# Patient Record
Sex: Male | Born: 1945 | Race: White | Hispanic: No | Marital: Married | Smoking: Former smoker
Health system: Southern US, Community
[De-identification: ages and names within clinical notes are randomized; demographics above are authoritative.]

## PROBLEM LIST (undated history)

## (undated) DIAGNOSIS — E785 Hyperlipidemia, unspecified: Secondary | ICD-10-CM

## (undated) DIAGNOSIS — I82409 Acute embolism and thrombosis of unspecified deep veins of unspecified lower extremity: Secondary | ICD-10-CM

## (undated) DIAGNOSIS — I10 Essential (primary) hypertension: Secondary | ICD-10-CM

## (undated) DIAGNOSIS — IMO0002 Reserved for concepts with insufficient information to code with codable children: Secondary | ICD-10-CM

## (undated) DIAGNOSIS — K5720 Diverticulitis of large intestine with perforation and abscess without bleeding: Secondary | ICD-10-CM

## (undated) DIAGNOSIS — G4730 Sleep apnea, unspecified: Secondary | ICD-10-CM

## (undated) DIAGNOSIS — D72829 Elevated white blood cell count, unspecified: Secondary | ICD-10-CM

## (undated) HISTORY — DX: Hyperlipidemia, unspecified: E78.5

## (undated) HISTORY — PX: COLON SURGERY: SHX602

## (undated) HISTORY — PX: PROSTATE SURGERY: SHX751

## (undated) HISTORY — DX: Elevated white blood cell count, unspecified: D72.829

## (undated) HISTORY — DX: Essential (primary) hypertension: I10

## (undated) HISTORY — DX: Diverticulitis of large intestine with perforation and abscess without bleeding: K57.20

## (undated) HISTORY — DX: Reserved for concepts with insufficient information to code with codable children: IMO0002

## (undated) HISTORY — DX: Sleep apnea, unspecified: G47.30

## (undated) HISTORY — DX: Acute embolism and thrombosis of unspecified deep veins of unspecified lower extremity: I82.409

---

## 1999-10-23 ENCOUNTER — Encounter: Payer: Self-pay | Admitting: Family Medicine

## 1999-10-23 ENCOUNTER — Inpatient Hospital Stay (HOSPITAL_COMMUNITY): Admission: RE | Admit: 1999-10-23 | Discharge: 1999-10-29 | Payer: Self-pay | Admitting: Family Medicine

## 2003-08-28 ENCOUNTER — Ambulatory Visit (HOSPITAL_BASED_OUTPATIENT_CLINIC_OR_DEPARTMENT_OTHER): Admission: RE | Admit: 2003-08-28 | Discharge: 2003-08-28 | Payer: Self-pay | Admitting: Internal Medicine

## 2006-02-10 ENCOUNTER — Ambulatory Visit (HOSPITAL_COMMUNITY): Admission: RE | Admit: 2006-02-10 | Discharge: 2006-02-10 | Payer: Self-pay | Admitting: Urology

## 2006-03-29 ENCOUNTER — Ambulatory Visit: Payer: Self-pay | Admitting: Oncology

## 2006-04-05 LAB — COMPREHENSIVE METABOLIC PANEL
ALT: 15 U/L (ref 0–40)
AST: 22 U/L (ref 0–37)
Albumin: 4.3 g/dL (ref 3.5–5.2)
Alkaline Phosphatase: 66 U/L (ref 39–117)
Glucose, Bld: 136 mg/dL — ABNORMAL HIGH (ref 70–99)
Potassium: 4 mEq/L (ref 3.5–5.3)
Sodium: 138 mEq/L (ref 135–145)
Total Protein: 6.8 g/dL (ref 6.0–8.3)

## 2006-04-05 LAB — HYPERCOAGULABLE PANEL, COMPREHENSIVE
Beta-2 Glyco I IgG: <4 U/mL (ref <20)
Homocysteine: 13.3 umol/L (ref 4.0–15.4)
Protein C Activity: 119 % (ref 91–147)
Protein C, Total: 82 % (ref 63–153)
Protein S Ag, Total: 88 % (ref 58–146)

## 2006-05-16 ENCOUNTER — Encounter (INDEPENDENT_AMBULATORY_CARE_PROVIDER_SITE_OTHER): Payer: Self-pay | Admitting: Specialist

## 2006-05-16 ENCOUNTER — Observation Stay (HOSPITAL_COMMUNITY): Admission: RE | Admit: 2006-05-16 | Discharge: 2006-05-17 | Payer: Self-pay | Admitting: Urology

## 2010-06-03 ENCOUNTER — Encounter: Admission: RE | Admit: 2010-06-03 | Discharge: 2010-06-03 | Payer: Self-pay | Admitting: Otolaryngology

## 2011-04-02 NOTE — Op Note (Signed)
NAMELELON, IKARD NO.:  [id number]   MEDICAL RECORD NO.:  [id number]          PATIENT TYPE:  INP   LOCATION:  0003                         FACILITY:  [facility]   PHYSICIAN:  Heloise Purpura, MD      DATE OF BIRTH:  [birth year]   DATE OF PROCEDURE:  05/16/2006  DATE OF DISCHARGE:                                 OPERATIVE REPORT   PREOPERATIVE DIAGNOSIS:  Clinically localized adenocarcinoma of prostate.   POSTOPERATIVE DIAGNOSIS:  Clinically localized adenocarcinoma of prostate.   PROCEDURE:  1.  Robotic assisted laparoscopic radical prostatectomy (bilateral nerve      sparing).  2.  Laparoscopic bilateral pelvic lymphadenectomy.   SURGEON:  Heloise Purpura, MD   ASSISTANT:  Dr. Cornelious Bryant   ANESTHESIA:  General.   COMPLICATIONS:  None.   ESTIMATED BLOOD LOSS:  500 mL.   INTRAVENOUS FLUIDS:  1000 mL of lactated Ringer's   SPECIMENS:  1.  Prostate and seminal vesicles.  2.  Right pelvic lymph nodes.  3.  Left pelvic lymph nodes.   DRAINS:  1.  20-French Coude catheter.  2.  #19 Blake pelvic drain.   INDICATIONS:  Mr. Fults is a 65 year old gentleman with clinical stage T1C  prostate cancer with a PSA of 13.74 and a Gleason score 3 +4 equals 7.  His  preoperative IPSS score was 2 then IIEF score of 25.  After discussing  management options for clinically localized prostate cancer, undergoing a  metastatic evaluation including a bone scan and CT scan which were both  negative, the patient elected to proceed with the above procedures.  Potential risks and benefits of these procedures were discussed with the  patient and he consented.   DESCRIPTION OF PROCEDURE:  The patient was taken to the operating room and a  general anesthetic was administered.  He was given preoperative antibiotics,  placed in the dorsal lithotomy position, prepped and draped in the usual  sterile fashion.  A preoperative time out was performed.  Next a Foley  catheter was  inserted into the bladder.  A site was then selected 18 cm from  the pubic symphysis and just to left of the umbilicus for placement of the  camera port.  This was placed using a standard open Hasson technique.  This  allowed entry into the peritoneal cavity under direct vision.  A 12 mm port  was placed and a pneumoperitoneum was established.  The remaining ports were  then placed.  Bilateral 8 mm robotic ports were placed 16 cm from the pubic  symphysis and 10 cm lateral to the camera port.  An additional 8 mm port was  placed in the far left lateral abdominal wall.  A 5 mm port was placed  between the camera port and the right robotic port.  An additional 12 mm  port was placed in the far right lateral abdominal wall for laparoscopic  assistance.  All ports were placed under direct vision without difficulty.  The surgical cart was then docked.  With the aid of the cautery scissors,  the bladder was reflected posteriorly  allowing entry into the space of  Retzius and identification of the endopelvic fascia and prostate.  The  endopelvic fascia was then incised from the apex back to the base of the  prostate bilaterally and the underlying levator muscle fibers were swept  laterally off the prostate.  This isolated the dorsal venous complex which  was then stapled and divided with a 45 mm flex ETS stapler.  The bladder  neck was then identified with the aid of Foley catheter manipulation.  The  anterior bladder neck was then incised allowing identification of the Foley  catheter.  The Foley catheter balloon was deflated and the catheter was  brought into the operative field and used to retract the prostate  anteriorly.  This allowed exposure of the posterior bladder neck.  Due to  the patient's large prostate size which was known preoperatively, a careful  examination was performed to determine if there was a median lobe.  There  did appear to be a small median lobe.  Indigo carmine was  administered to  allow identification of the ureteral orifices.  The median lobe was then  excised and dissection continued posteriorly until the vasa deferentia and  seminal vesicles were identified.  The vasa deferentia were isolated and  divided.  The seminal vesicles were similarly isolated free from the  surrounding structures with care to provide adequate hemostasis on the  seminal vesicle artery bilaterally.  The seminal vesicles were then lifted  anteriorly and the space between Denonvilliers' fascia and the anterior  rectum was bluntly developed thereby isolating the vascular pedicles of the  prostate.  Attention then turned to the anterior portion of the prostate.  The lateral prostatic fascia was sharply incised bilaterally allowing the  neurovascular bundles to be swept laterally and posteriorly off the  prostate.  The vascular pedicles of prostate were then ligated with  hemoclips and sharply divided above the level of the neurovascular bundles  resulting in bilateral nerve preservation.  The neurovascular bundles were  then swept off the prostate out to the urethra apically.  The urethra was  then identified.  There was noted to be venous bleeding from the dorsal  venous complex.  Therefore a figure-of-eight 0 PDS suture was placed along  with a buttressing suture to provide hemostasis.  The urethra was then  isolated and sharply divided allowing the prostate specimen to be  disarticulated and placed up into the abdominal cavity.  The pelvis was then  copiously irrigated and with irrigation in the pelvis, air was injected into  the rectal catheter and there was no evidence of a rectal injury.  Hemostasis appeared excellent and attention turned to the right pelvic  sidewall.  The fibrofatty tissue between the external iliac vein, confluence  of the iliac vessels, obturator nerve, and Cooper's ligament was dissected free from the pelvic sidewall with hemoclips used for  hemostasis and  lymphostasis.  This lymphatic packet was then removed for permanent  pathologic analysis.  An identical procedure was then performed on the  contralateral side.  Attention then turned to the urethral anastomosis.  A  double-armed 3-0 Monocryl suture was used to perform a 360 degrees running  anastomosis between the bladder neck and urethra in a tension-free fashion.  A new 20-French Coude catheter was then inserted into the bladder and  irrigated.  There no blood clots in the bladder and the anastomosis appeared  to be watertight.  A #19 Blake drain was then brought through the left  robotic port and appropriately positioned in the pelvis.  It was secured to  the skin with a nylon suture.  The surgical cart was then undocked.  The  prostate specimen was then placed into the Endopouch retrieval bag via the  periumbilical port site for later removal.  A 0 Vicryl stitch was placed  with the aid of the [employer] suture passer for closure of the right  lateral 12 mm port site.  The periumbilical port incision was then slightly  extended inferiorly allowing the prostate specimen to be removed intact  within the Endopouch retrieval bag.  This fascial opening was then closed  with two running 0 Vicryl sutures which were tied in the midportion of the  incision.  Prior to removal of the camera.  All other remaining ports were  removed under direct vision.  Quarter percent Marcaine was then injected  into all incision sites and staples were used to reapproximate the skin at  all  incision sites.  All sponge and needle counts were correct x2 at the end of  procedure.  The patient appeared to tolerate the procedure well and without  complications.  He was able to be extubated and transferred to recovery in  excellent condition.           ______________________________  Heloise Purpura, MD  Electronically Signed     LB/MEDQ  D:  05/16/2006  T:  05/16/2006  Job:  [id number]

## 2011-04-02 NOTE — H&P (Signed)
NAMEJACKSYN, [name] NO.:  [id number]   MEDICAL RECORD NO.:  [id number]          PATIENT TYPE:  INP   LOCATION:  0003                         [facility]ACILITY:  [facility]   PHYSICIAN:  Heloise Purpura, MD      DATE OF BIRTH:  November 25, 1945   DATE OF ADMISSION:  05/16/2006  DATE OF DISCHARGE:                                HISTORY & PHYSICAL   CHIEF COMPLAINT:  Prostate cancer.   HISTORY:  [name] is a 64 year old gentleman with recently diagnosed  clinical stage T1C prostate cancer with a PSA of 13.74 and Gleason score 3 +  4 = 7.  He underwent a CT scan and bone scan, and which were both negative  for metastatic disease.  After discussing options for clinically localized  prostate cancer management, he elected to proceed with surgical therapy.   PAST MEDICAL HISTORY:  1.  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  He has undergone an evaluation by Dr. Eli Hose at the cancer      center.  Dr. 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  He has made recommendations of early      mobilization and compression stockings.  2.  Sleep apnea, requiring CPAP.   PAST SURGICAL HISTORY:  None.   MEDICATIONS:  Avodart 0.5 mg p.o. daily.   ALLERGIES:  NO KNOWN DRUG ALLERGIES.   FAMILY HISTORY:  No GU malignancy of prostate cancer.   SOCIAL HISTORY:  The patient works as an Personnel officer.  He smoked 1 pack of  cigarettes per day for 15 years and quit in 1976.  He denies alcohol use.   PHYSICAL EXAMINATION:  CONSTITUTIONAL:  The patient is a well-nourished,  well-developed age-appropriate male in no acute distress.  CARDIOVASCULAR:  Regular rate and rhythm without obvious murmurs.  LUNGS:  Clear bilaterally.  ABDOMEN:  Soft, nontender, nondistended and mildly obese.  DIGITAL RECTAL EXAM:  The patient is prostate is significantly enlarged  without nodularity or induration.   IMPRESSION:   Clinically localized intermediate risk prostate cancer.   PLAN:  [name] will undergo robotic assisted laparoscopic radical  prostatectomy and bilateral pelvic lymphadenectomy.  He will then be  admitted to the hospital for routine postoperative care.           ______________________________  Heloise Purpura, MD  Electronically Signed     LB/MEDQ  D:  05/16/2006  T:  05/16/2006  Job:  [id number]   cc:   Blenda Nicely. [employer]

## 2011-04-02 NOTE — Discharge Summary (Signed)
[name], [name] NO.:  [id number]   MEDICAL RECORD NO.:  [id number]          PATIENT TYPE:  INP   LOCATION:  1418                         [facility]AC[facility]   PHYSICIAN:  Heloise Purpura, MD      DATE OF BIRTH:  1946-05-07   DATE OF ADMISSION:  05/16/2006  DATE OF DISCHARGE:  05/17/2006                                 DISCHARGE SUMMARY   ADMISSION DIAGNOSIS:  Prostate cancer.   DISCHARGE DIAGNOSIS:  Prostate cancer.   PROCEDURES:  1.  Robotic-assisted laparoscopic radical prostatectomy.  2.  Bilateral pelvic lymphadenectomy.   HISTORY:  [name] is a [age]-year-old patient with clinical stage T1C  prostate cancer with a PSA of 13.74 and Gleason score of 3+4=7.  He  underwent a metastatic evaluation including a CT scan and bone scan, which  were both negative for metastatic prostate cancer.  After discussing options  for clinically localized adenocarcinoma of the prostate, he elected to  proceed with a robotic-assisted laparoscopic radical prostatectomy.   HOSPITAL COURSE:  On May 16, 2006, the patient was admitted to the hospital  and taken to the operating room, he underwent an uneventful robotic  prostatectomy.  Postoperatively, he was able to be transferred to a regular  hospital room following recovery from anesthesia.  As soon as he was able to  ambulate postoperatively, he was begun ambulating.  In addition, due to his  prior history of a deep venous thrombosis, he was administered subcutaneous  heparin while hospitalized and also managed with pneumatic compression hose.  By the morning of postoperative day #1, the patient was ambulating without  difficulty.  He was able to begin a clear liquid diet and then transitioned  to oral pain medication.  By the afternoon of postoperative day #1, his  pelvic drain had minimal output and he maintained excellent urine output.  Therefore, his pelvic drain was able to be discontinued and after receiving  instructions on Foley catheter care was able to be discharged home in  excellent condition.   DISPOSITION:  Home.   DISCHARGE MEDICATIONS:  [name] was instructed to resume his regular  home medications except for his Avodart, which was discontinued.  He was  also told to refrain from using any aspirin, nonsteroidal anti-inflammatory  drugs, or herbal supplements.  He was given a prescription to take Vicodin  as needed for pain, Cipro to begin 1 day prior to his return visit for Foley  catheter removal, and Colace to use as a stool softener.   DISCHARGE INSTRUCTIONS:  The patient was instructed on the extreme  importance of frequent mobilization and ambulation at home to prevent a  thromboembolism.  He was also carefully instructed on the signs and symptoms  that he should be aware of.  He was instructed to gradually advance his diet  once passing flatus.  The patient was also told that he should refrain from  any heavy lifting, strenuous activity, or driving.  He was instructed on the  signs and symptoms of wound infection as well as routine Foley catheter care  and told  to call should he have any problems.   FOLLOW UP:  [name] will return in 1 week for removal of his Foley  catheter and to review his surgical pathology in detail.           ______________________________  Heloise Purpura, MD  Electronically Signed     LB/MEDQ  D:  05/18/2006  T:  05/18/2006  Job:  [id number]   cc:   Jamison Neighbor, M.D.  Fax: [phone]

## 2011-05-10 ENCOUNTER — Emergency Department (INDEPENDENT_AMBULATORY_CARE_PROVIDER_SITE_OTHER): Payer: 59

## 2011-05-10 ENCOUNTER — Emergency Department (HOSPITAL_BASED_OUTPATIENT_CLINIC_OR_DEPARTMENT_OTHER)
Admission: EM | Admit: 2011-05-10 | Discharge: 2011-05-10 | Disposition: A | Discharge status: Other Acute Inpatient Hospital | Payer: 59 | Source: Home / Self Care | Attending: Emergency Medicine | Admitting: Emergency Medicine

## 2011-05-10 ENCOUNTER — Emergency Department (HOSPITAL_COMMUNITY): Payer: 59

## 2011-05-10 ENCOUNTER — Inpatient Hospital Stay (HOSPITAL_COMMUNITY)
Admission: EM | Admit: 2011-05-10 | Discharge: 2011-05-18 | DRG: 357 | Disposition: A | Discharge status: Home / Self Care | Payer: 59 | Attending: Surgery | Admitting: Surgery

## 2011-05-10 DIAGNOSIS — Q619 Cystic kidney disease, unspecified: Secondary | ICD-10-CM | POA: Insufficient documentation

## 2011-05-10 DIAGNOSIS — K560 Paralytic ileus: Secondary | ICD-10-CM | POA: Diagnosis not present

## 2011-05-10 DIAGNOSIS — K5730 Diverticulosis of large intestine without perforation or abscess without bleeding: Secondary | ICD-10-CM | POA: Insufficient documentation

## 2011-05-10 DIAGNOSIS — I10 Essential (primary) hypertension: Secondary | ICD-10-CM | POA: Insufficient documentation

## 2011-05-10 DIAGNOSIS — K631 Perforation of intestine (nontraumatic): Secondary | ICD-10-CM

## 2011-05-10 DIAGNOSIS — G4733 Obstructive sleep apnea (adult) (pediatric): Secondary | ICD-10-CM | POA: Diagnosis present

## 2011-05-10 DIAGNOSIS — K5732 Diverticulitis of large intestine without perforation or abscess without bleeding: Secondary | ICD-10-CM

## 2011-05-10 DIAGNOSIS — K929 Disease of digestive system, unspecified: Secondary | ICD-10-CM | POA: Diagnosis not present

## 2011-05-10 DIAGNOSIS — R109 Unspecified abdominal pain: Secondary | ICD-10-CM

## 2011-05-10 DIAGNOSIS — Y838 Other surgical procedures as the cause of abnormal reaction of the patient, or of later complication, without mention of misadventure at the time of the procedure: Secondary | ICD-10-CM | POA: Diagnosis not present

## 2011-05-10 DIAGNOSIS — Z8546 Personal history of malignant neoplasm of prostate: Secondary | ICD-10-CM

## 2011-05-10 DIAGNOSIS — R112 Nausea with vomiting, unspecified: Secondary | ICD-10-CM

## 2011-05-10 DIAGNOSIS — R1031 Right lower quadrant pain: Secondary | ICD-10-CM | POA: Insufficient documentation

## 2011-05-10 DIAGNOSIS — R1013 Epigastric pain: Secondary | ICD-10-CM | POA: Diagnosis present

## 2011-05-10 LAB — COMPREHENSIVE METABOLIC PANEL
ALT: 17 U/L (ref 0–53)
Alkaline Phosphatase: 63 U/L (ref 39–117)
BUN: 19 mg/dL (ref 6–23)
CO2: 27 mEq/L (ref 19–32)
Glucose, Bld: 162 mg/dL — ABNORMAL HIGH (ref 70–99)
Potassium: 3.3 mEq/L — ABNORMAL LOW (ref 3.5–5.1)
Sodium: 136 mEq/L (ref 135–145)

## 2011-05-10 LAB — DIFFERENTIAL
Basophils Relative: 0 % (ref 0–1)
Lymphocytes Relative: 10 % — ABNORMAL LOW (ref 12–46)
Monocytes Absolute: 1.6 10*3/uL — ABNORMAL HIGH (ref 0.1–1.0)
Monocytes Relative: 9 % (ref 3–12)
Neutro Abs: 14.3 10*3/uL — ABNORMAL HIGH (ref 1.7–7.7)
Neutrophils Relative %: 81 % — ABNORMAL HIGH (ref 43–77)

## 2011-05-10 LAB — URINALYSIS, ROUTINE W REFLEX MICROSCOPIC
Bilirubin Urine: NEGATIVE
Hgb urine dipstick: NEGATIVE
Ketones, ur: NEGATIVE mg/dL
Nitrite: NEGATIVE
Protein, ur: NEGATIVE mg/dL
Specific Gravity, Urine: 1.024 (ref 1.005–1.030)
Urobilinogen, UA: 1 mg/dL (ref 0.0–1.0)

## 2011-05-10 LAB — CBC
HCT: 41 % (ref 39.0–52.0)
Hemoglobin: 14.5 g/dL (ref 13.0–17.0)
MCH: 30.5 pg (ref 26.0–34.0)
RBC: 4.76 MIL/uL (ref 4.22–5.81)

## 2011-05-10 LAB — URINE MICROSCOPIC-ADD ON

## 2011-05-10 MED ORDER — IOHEXOL 300 MG/ML  SOLN
100.0000 mL | Freq: Once | INTRAMUSCULAR | Status: AC | PRN
  Administered 2011-05-10: 97 mL via INTRAVENOUS

## 2011-05-11 LAB — COMPREHENSIVE METABOLIC PANEL
Alkaline Phosphatase: 52 U/L (ref 39–117)
BUN: 15 mg/dL (ref 6–23)
CO2: 28 mEq/L (ref 19–32)
Chloride: 100 mEq/L (ref 96–112)
Creatinine, Ser: 1.3 mg/dL (ref 0.50–1.35)
GFR calc non Af Amer: 56 mL/min — ABNORMAL LOW (ref >60)
Potassium: 4.1 mEq/L (ref 3.5–5.1)
Total Bilirubin: 1.5 mg/dL — ABNORMAL HIGH (ref 0.3–1.2)

## 2011-05-11 LAB — CBC
HCT: 44.2 % (ref 39.0–52.0)
Hemoglobin: 14.8 g/dL (ref 13.0–17.0)
MCV: 88.9 fL (ref 78.0–100.0)
RBC: 4.97 MIL/uL (ref 4.22–5.81)
WBC: 15.1 10*3/uL — ABNORMAL HIGH (ref 4.0–10.5)

## 2011-05-11 NOTE — Op Note (Signed)
[name], [name] NO.:  [id number]  MEDICAL RECORD NO.:  [id number]  LOCATION:  1540                         FACILITY:  [facility]  PHYSICIAN:  Wilmon Arms. Corliss Skains, M.D. DATE OF BIRTH:  01-22-1946  DATE OF PROCEDURE:  05/10/2011 DATE OF DISCHARGE:                              OPERATIVE REPORT   PREOPERATIVE DIAGNOSIS:  Free intraperitoneal air.  POSTOPERATIVE DIAGNOSIS:  Perforated diverticulitis.  PROCEDURE:  Exploratory laparotomy, placement of pelvic drain.  SURGEON:  Wilmon Arms. Corliss Skains, M.D.  ASSISTANT:  Adolph Pollack, M.D.  ANESTHESIA:  General endotracheal.  INDICATIONS:  This is a 65 year old male in reasonably good health, who presented with 24 hours of worsening abdominal pain.  He was evaluated in the Emergency Department at [facility] and was noted to have elevated white blood cell count as well as free intraperitoneal air on plain film and CT scan.  The CT scan was unable to discern the etiology of his free air, but he was emergently transferred for surgical evaluation.  We recommended exploratory laparotomy.  DESCRIPTION OF PROCEDURE:  The patient was brought to the operating, placed in supine position on operating table.  After an adequate level of general anesthesia was obtained, the patient's abdomen was shaved, prepped with ChloraPrep and draped in sterile fashion.  A Foley catheter had been placed under sterile technique.  A time-out was taken that showed the proper patient, proper procedure.  We made a vertical midline incision in the upper abdomen.  Dissection was carried down to the fascia.  We entered the peritoneal cavity bluntly.  No rush of air was noted.  No purulent fluid was encountered.  Minimal free fluid was in the abdomen.  There was no foul odor.  We opened the incision widely and began exploring the abdomen.  We began at the stomach.  The stomach appeared normal anteriorly.  We could not palpate any area  of thickening.  We examined the pylorus and the first part of the duodenum. We opened the lesser sac and the posterior stomach also.  It felt normal.  The gallbladder was noninflamed and no stones were palpated. The liver appeared grossly normal.  We identified the ligament of Treitz and ran the small bowel distally to the cecum.  This all appeared normal.  The appendix was identified and was normal.  The cecum was soft and free of any inflammation.  We traced the colon up the ascending colon across the transverse colon down the descending colon.  The patient had some adhesions in the pelvis from his previous prostatectomy.  We took down some of these adhesions and we were able to identify the sigmoid colon.  There is minimal thickening, but the patient has extensive diverticular disease.  We located one area of focal thickening and this appeared to be a perforated diverticulum that had subsequently sealed itself off.  There was virtually no contamination around this area.  There was a little bit of inflammation in the fat around the diverticulum.  The colon itself appeared soft and normal.  There was no purulence in the pelvis.  We took some of the adjacent appendices epiploica and sewed these over the sealed  off perforation.  We then thoroughly irrigated the pelvis.  Two pelvic drains were placed through stab incisions on each side and placed down to the pelvis next to the sigmoid colon.  The area of the sigmoid colon that is involved seems to be fairly proximal.  We then thoroughly irrigated all four quadrants of the abdomen with warm saline.  The fascia was then reapproximated with double-stranded #1 PDS.  Staples were used to close the skin.  The drains were placed to bulb suction.  The patient was extubated and brought to recovery room in stable condition.  All sponge, instrument, needle counts correct.     Wilmon Arms. Corliss Skains, M.D.     MKT/MEDQ  D:  05/10/2011  T:   05/10/2011  Job:  [id number]  Electronically Signed by Manus Rudd M.D. on 05/11/2011 08:44:17 PM

## 2011-05-13 LAB — BASIC METABOLIC PANEL
CO2: 28 mEq/L (ref 19–32)
Calcium: 8.2 mg/dL — ABNORMAL LOW (ref 8.4–10.5)
Chloride: 102 mEq/L (ref 96–112)
Creatinine, Ser: 1.08 mg/dL (ref 0.50–1.35)
GFR calc Af Amer: >60 mL/min (ref >60)
Sodium: 135 mEq/L (ref 135–145)

## 2011-05-13 LAB — CBC
MCH: 29.5 pg (ref 26.0–34.0)
Platelets: 185 10*3/uL (ref 150–400)
RBC: 4.17 MIL/uL — ABNORMAL LOW (ref 4.22–5.81)
RDW: 12.6 % (ref 11.5–15.5)
WBC: 9.4 10*3/uL (ref 4.0–10.5)

## 2011-05-16 LAB — CBC
MCV: 86.6 fL (ref 78.0–100.0)
Platelets: 240 10*3/uL (ref 150–400)
RBC: 4.49 MIL/uL (ref 4.22–5.81)
RDW: 12.8 % (ref 11.5–15.5)
WBC: 9.5 10*3/uL (ref 4.0–10.5)

## 2011-05-18 NOTE — H&P (Signed)
NAMENEEV, MCMAINS NO.:  [id number]  MEDICAL RECORD NO.:  [id number]  LOCATION:  0098                         FACILITY:  [facility]  PHYSICIAN:  Wilmon Arms. Corliss Skains, M.D. DATE OF BIRTH:  [birth year]  DATE OF ADMISSION:  05/10/2011 DATE OF DISCHARGE:                             HISTORY & PHYSICAL   TIME OF ADMISSION:  11:20 a.m.  ADMITTING PHYSICIAN:  Wilmon Arms. Laelyn Blumenthal, M.D.  PRIMARY CARE PHYSICIAN:  Soyla Murphy. Renne Crigler, M.D.  CHIEF COMPLAINT:  Abdominal pain.  HISTORY OF PRESENT ILLNESS:  Mr. Mogel is a 65 year old white male with a history of hypertension, obstructive sleep apnea, and prostate cancer who developed epigastric pain yesterday around 1700.  He took 2 Motrin yesterday morning for some hip pain he was having.  As his pain progressed from the epigastric region, it began tracking down the right side.  He subsequently developed nausea and vomiting but denies any fevers or chills.  Overnight, the pain progressively worsened.  The patient presented to [facility] for further evaluation.  Upon arrival, he was noted to have a white blood cell count of 17,000.  He then had a CT scan of the abdomen and pelvis which revealed pneumoperitoneum.  There were diverticula seen but no evidence of diverticulitis.  An unknown source was able to be identified but he definitely had free air from a perforated viscus.  At this time, the patient was transferred to [facility] Emergency Department and we gladly accepted the patient.  REVIEW OF SYSTEMS:  Please see HPI.  Otherwise, all other systems have been reviewed and are essentially negative.  He does complain of some mild incontinence after he had a prostatectomy.  FAMILY HISTORY:  Noncontributory.  He does have a family history of hypertension and a mom who has lung cancer.  PAST MEDICAL HISTORY: 1. Hypertension. 2. Obstructive sleep apnea. 3. Prostate cancer.  PAST SURGICAL HISTORY:  Laparoscopic  robotic prostatectomy in 2007 by Dr. Laverle Patter.  SOCIAL HISTORY:  The patient is married.  He denies any alcohol, tobacco or illicit drug abuse.  He is an Personnel officer.  ALLERGIES:  NKDA.  MEDICATIONS:  Include: 1. Triamterene/hydrochlorothiazide 37.5/25 mg half tablet daily. 2. Pepto-Bismol as needed. 3. Ibuprofen 200 mg 2 tablets as needed. 4. Multivitamin.  PHYSICAL EXAMINATION:  GENERAL:  Mr. Hark is a very pleasant, obese 65 year old white male who is currently in bed, in no acute distress. VITAL SIGNS:  Temperature 98.1, pulse 86, respirations 16, blood pressure 124/66. HEENT:  Head is normocephalic, atraumatic.  Sclerae noninjected.  Pupils are equal, round and reactive to light.  Ears and nose without any obvious masses or lesions.  No rhinorrhea.  Mouth is pink.  Throat shows no exudate. HEART:  Regular rate and rhythm.  Normal S1 and S2.  No murmurs, gallops, or rubs are noted.  He does have palpable carotid, radial and pedal pulses bilaterally. LUNGS:  Clear to auscultation bilaterally with no wheezes, rhonchi or rales noted.  Respiratory effort is nonlabored. ABDOMEN:  Soft with very minimal distention.  He does have active bowel sounds.  He is tender in the epigastric region as well as radiating down the right side of  his abdomen.  He does have some mild focal peritonitis on the right side.  Otherwise, no rebounding or guarding is noted.  He does not have any masses, hernias or organomegaly noted. MUSCULOSKELETAL:  All 4 extremities are symmetrical.  No cyanosis, clubbing or edema. NEURO:  Cranial nerves II through XII appeared to be grossly intact. Deep tendon reflex exam is deferred at this time. PSYCH:  The patient is alert and oriented x3 with an appropriate affect.  LABORATORY DATA AND DIAGNOSTICS:  White blood cell count is 17600, hemoglobin 14.5, hematocrit 41.0, platelet count is 179,000.  Sodium 136, potassium 3.3, glucose 162, BUN 19, creatinine 1.20.  CT  scan of the abdomen and pelvis reveals pneumoperitoneum secondary to perforated viscus.  Source was unable to be identified.  He does have diverticula in his colon but no noted diverticulitis.  IMPRESSION: 1. Perforated viscus, likely gastric or duodenal perforation given his     history. 2. Hypertension. 3. Leukocytosis. 4. Obstructive sleep apnea. 5. History of prostate cancer.  PLAN:  At this time, we will get the patient admitted, start him on IV fluids as well as IV antibiotics.  He will need to go to the operating room for an exploratory laparotomy to repair the perforated viscus.  We have explained the procedure along with risks and complications to the patient as well as his wife who is present.  These include bleeding, infection, possible ostomy.  The patient and his wife understand and wish to proceed.     Letha Cape, PA   ______________________________ Wilmon Arms. Corliss Skains, M.D.    KEO/MEDQ  D:  05/10/2011  T:  05/10/2011  Job:  [id number]  cc:   Heloise Purpura, MD Fax: [phone]  Soyla Murphy. Renne Crigler, M.D. Fax: [phone]  [facility]  Electronically Signed by Barnetta Chapel PA on 05/13/2011 11:58:08 AM Electronically Signed by Manus Rudd M.D. on 05/18/2011 01:04:50 PM

## 2011-05-20 ENCOUNTER — Encounter (INDEPENDENT_AMBULATORY_CARE_PROVIDER_SITE_OTHER): Payer: Self-pay | Admitting: Surgery

## 2011-05-21 ENCOUNTER — Encounter (INDEPENDENT_AMBULATORY_CARE_PROVIDER_SITE_OTHER): Payer: Self-pay | Admitting: Surgery

## 2011-05-21 ENCOUNTER — Ambulatory Visit (INDEPENDENT_AMBULATORY_CARE_PROVIDER_SITE_OTHER): Payer: 59 | Admitting: Surgery

## 2011-05-21 VITALS — BP 118/81 | HR 74 | Temp 98.0°F | Ht 70.0 in | Wt 209.8 lb

## 2011-05-21 DIAGNOSIS — K5720 Diverticulitis of large intestine with perforation and abscess without bleeding: Secondary | ICD-10-CM | POA: Insufficient documentation

## 2011-05-21 DIAGNOSIS — K5732 Diverticulitis of large intestine without perforation or abscess without bleeding: Secondary | ICD-10-CM

## 2011-05-21 NOTE — Progress Notes (Deleted)
Subjective:    Patient ID: [name] is a 65 y.o. male.  BP 118/81  Pulse 74  Temp(Src) 98 F (36.7 C) (Temporal)  Ht 5\' 10"  (1.778 m)  Wt 209 lb 12.8 oz (95.165 kg)  BMI 30.10 kg/m2   Chief Complaint: HPI {Common ambulatory SmartLinks:19316} Review of Systems  Objective:  Physical Exam  Lab Review:  {Recent labs:19471::"not applicable"}  Assessment:  Assessment Diagnosis 1. Diverticulitis of colon with perforation      Plan:  Plan ***

## 2011-05-21 NOTE — Progress Notes (Signed)
This is a 65 year old male who was admitted emergently to the hospital on May 10, 2011 with free intraperitoneal air. We brought him emergently to the operating room for exploratory laparotomy. He had minimal contamination and was found to have a single perforated diverticulum in the sigmoid colon near the midline. We washed him out and place drains. We kept him on IV antibiotics for several days. He improved and had his drains removed. He was discharged home on May 18, 2011. He was sent home on Augmentin. He is complaining of some incisional pain and soreness. His appetite is improving but is still not back to normal. He denies any fever. He is using MiraLax p.r.n. for bowel movements. He is taking just a few Percocet pills each day.  Physical examination Height 5 feet 10 inches weight 209 Blood pressure 118/81 pulse 74 temperature 98.0 Well-developed well-nourished male in no apparent distress Abdomen soft minimal incisional tenderness, incision seems to be healing well with no sign of infection, staple line is intact with no drainage, erythema, drain sites are healing well,  Impression: Perforated sigmoid diverticulitis status post exploratory laparotomy washout and drainage. The patient seems to be recovering well from his surgery. Plan: We will remove the staples today and replaced with Steri-Strips. He should continue his antibiotics until finished. He will purchase an abdominal binder to use p.r.n. for his abdominal soreness when he is ambulating. We will reevaluate him in 2-3 weeks. We will try to obtain his last colonoscopy records from [facility]. At his next visit we will discuss possible elective sigmoid colectomy.

## 2011-05-21 NOTE — Patient Instructions (Signed)
Wear an abdominal binder as needed.

## 2011-05-24 NOTE — Discharge Summary (Signed)
NAMETAMMY, [name] NO.:  [id number]  MEDICAL RECORD NO.:  [id number]  LOCATION:  1540                         FACILITY:  [facility]  PHYSICIAN:  Currie Paris, M.D.DATE OF BIRTH:  1946-09-09  DATE OF ADMISSION:  05/10/2011 DATE OF DISCHARGE:  05/18/2011                              DISCHARGE SUMMARY   DISCHARGING PHYSICIAN:  Currie Paris, MD  PROCEDURE:  Exploratory laparotomy with abdominal washout and placement of pelvic drain by Dr. Manus Rudd on May 10, 2011.  CONSULTANTS:  None.  PRIMARY CARE PHYSICIAN:  Soyla Murphy. Renne Crigler, MD  REASON FOR ADMISSION:  [name] is a 65 year old male who developed epigastric abdominal pain around 5 o'clock the day prior to admission. He took 2 Motrin that morning for some hip pain he was having.  As his pain progressed from epigastric region, it began tracking down the right side of his abdomen.  He ultimately developed some nausea and vomiting. He presented to the [facility] for further evaluation the following day and he was found to have a white blood cell count of 17,000 and a CT scan which revealed pneumoperitoneum.  The patient did have evidence of diverticula, but no evidence of diverticulitis.  At this point in time, the patient was transferred to [facility] for further management.  Please see admitting history and physical for further details.  ADMITTING DIAGNOSES: 1. Perforated viscus, likely gastric or duodenal perforation given his     history. 2. Hypertension. 3. Leukocytosis. 4. Obstructive sleep apnea. 5. History of prostate cancer.  HOSPITAL COURSE:  At this time, the patient was admitted and placed on IV Invanz as well as IV antibiotics.  He was urgently taken to the operating room where he had an exploratory laparotomy.  This revealed a single perforation of diverticulum, no significant contamination or inflammation was seen.  His abdomen was washed out and 2 pelvic  drains were placed.  Postoperatively, the patient did well.  He had a postoperative ileus for several days.  By postoperative day #4, this was beginning to resolve as he was passing lots of flatus.  He was started on clear liquids.  Over the next several days, his diet was advanced as tolerated.  By postoperative day #7, he was able to be tolerated to a soft, low-fiber diet.  By postoperative day #8, he was tolerating his regular low-fiber diet.  His abdomen was soft with decreased tenderness. His staples were intact.  His JP drains had minimal serous output and these were discontinued prior to discharge home.  His other medical problems including his obstructive sleep apnea and his hypertension remained stable throughout the admission.  DISCHARGE DIAGNOSES: 1. Perforated diverticulitis, status post exploratory laparotomy with     abdominal washout and placement of pelvic drain. 2. Postoperative ileus, resolved. 3. Leukocytosis, resolved. 4. Hypertension. 5. Obstructive sleep apnea. 6. History of prostate cancer. DISCHARGE MEDICATIONS:  The patient may resume all home medications including, 1. Ibuprofen 200 mg as needed. 2. Multivitamin. 3. Pepto-Bismol as needed. 4. Triamterene/hydrochlorothiazide 37.5/25 mg half tablet daily. 5. He was given a new prescription for Augmentin 875 mg p.o. b.i.d.     for 10  days. 6. He was given a prescription of Percocet 5/325 mg 1-2 p.o. q.4 h.     p.r.n. pain #40.  DISCHARGE INSTRUCTIONS:  The patient may increase his activity slowly and walk up steps.  He may shower, however, he is not to bathe.  He is not to do any heavy lifting for the next 6 weeks.  He is not to drive for the next 1-2 weeks.  He is to resume a low-fiber diet.  He is to pat his staples dry for wound care.  He is to return to see Dr. Corliss Skains in clinic this Friday at 2:30 p.m. for staple removal.  He is to call our office for worsening pain, for fever greater than  101.5.     Letha Cape, PA   ______________________________ Currie Paris, M.D.    KEO/MEDQ  D:  05/18/2011  T:  05/18/2011  Job:  [id number]  cc:   Soyla Murphy. Renne Crigler, M.D. Fax: [phone]  Wilmon Arms. Corliss Skains, M.D. [address] [place] [address] [place] [address] [facility] [place]  Electronically Signed by Barnetta Chapel PA on 05/21/2011 04:26:43 PM Electronically Signed by Cyndia Bent M.D. on 05/24/2011 09:49:15 AM

## 2011-05-25 ENCOUNTER — Inpatient Hospital Stay (HOSPITAL_COMMUNITY)
Admission: EM | Admit: 2011-05-25 | Discharge: 2011-06-07 | DRG: 357 | Disposition: A | Discharge status: Home / Self Care | Payer: 59 | Attending: General Surgery | Admitting: General Surgery

## 2011-05-25 ENCOUNTER — Emergency Department (HOSPITAL_COMMUNITY): Payer: 59

## 2011-05-25 ENCOUNTER — Telehealth (INDEPENDENT_AMBULATORY_CARE_PROVIDER_SITE_OTHER): Payer: Self-pay | Admitting: General Surgery

## 2011-05-25 DIAGNOSIS — I10 Essential (primary) hypertension: Secondary | ICD-10-CM | POA: Diagnosis present

## 2011-05-25 DIAGNOSIS — Z8546 Personal history of malignant neoplasm of prostate: Secondary | ICD-10-CM

## 2011-05-25 DIAGNOSIS — Z86718 Personal history of other venous thrombosis and embolism: Secondary | ICD-10-CM

## 2011-05-25 DIAGNOSIS — T45515A Adverse effect of anticoagulants, initial encounter: Secondary | ICD-10-CM | POA: Diagnosis not present

## 2011-05-25 DIAGNOSIS — K2970 Gastritis, unspecified, without bleeding: Secondary | ICD-10-CM

## 2011-05-25 DIAGNOSIS — D62 Acute posthemorrhagic anemia: Secondary | ICD-10-CM | POA: Diagnosis not present

## 2011-05-25 DIAGNOSIS — I824Z9 Acute embolism and thrombosis of unspecified deep veins of unspecified distal lower extremity: Secondary | ICD-10-CM | POA: Diagnosis present

## 2011-05-25 DIAGNOSIS — Z9079 Acquired absence of other genital organ(s): Secondary | ICD-10-CM

## 2011-05-25 DIAGNOSIS — K2990 Gastroduodenitis, unspecified, without bleeding: Secondary | ICD-10-CM

## 2011-05-25 DIAGNOSIS — E46 Unspecified protein-calorie malnutrition: Secondary | ICD-10-CM | POA: Diagnosis present

## 2011-05-25 DIAGNOSIS — K5732 Diverticulitis of large intestine without perforation or abscess without bleeding: Secondary | ICD-10-CM | POA: Diagnosis present

## 2011-05-25 DIAGNOSIS — K264 Chronic or unspecified duodenal ulcer with hemorrhage: Principal | ICD-10-CM | POA: Diagnosis present

## 2011-05-25 DIAGNOSIS — G4730 Sleep apnea, unspecified: Secondary | ICD-10-CM | POA: Diagnosis present

## 2011-05-25 DIAGNOSIS — I82409 Acute embolism and thrombosis of unspecified deep veins of unspecified lower extremity: Secondary | ICD-10-CM

## 2011-05-25 DIAGNOSIS — R109 Unspecified abdominal pain: Secondary | ICD-10-CM

## 2011-05-25 DIAGNOSIS — K2980 Duodenitis without bleeding: Secondary | ICD-10-CM | POA: Diagnosis present

## 2011-05-25 LAB — TROPONIN I: Troponin I: <0.3 ng/mL (ref <0.30)

## 2011-05-25 LAB — CBC
HCT: 38.9 % — ABNORMAL LOW (ref 39.0–52.0)
Hemoglobin: 13.5 g/dL (ref 13.0–17.0)
MCV: 86.3 fL (ref 78.0–100.0)
RBC: 4.51 MIL/uL (ref 4.22–5.81)
RDW: 12.6 % (ref 11.5–15.5)
WBC: 13.7 10*3/uL — ABNORMAL HIGH (ref 4.0–10.5)

## 2011-05-25 LAB — APTT: aPTT: 32 seconds (ref 24–37)

## 2011-05-25 LAB — COMPREHENSIVE METABOLIC PANEL
AST: 18 U/L (ref 0–37)
Albumin: 3.3 g/dL — ABNORMAL LOW (ref 3.5–5.2)
Alkaline Phosphatase: 64 U/L (ref 39–117)
BUN: 21 mg/dL (ref 6–23)
Chloride: 96 mEq/L (ref 96–112)
Potassium: 3.7 mEq/L (ref 3.5–5.1)
Sodium: 132 mEq/L — ABNORMAL LOW (ref 135–145)
Total Bilirubin: 0.9 mg/dL (ref 0.3–1.2)
Total Protein: 6.9 g/dL (ref 6.0–8.3)

## 2011-05-25 LAB — DIFFERENTIAL
Basophils Absolute: 0 10*3/uL (ref 0.0–0.1)
Eosinophils Relative: 0 % (ref 0–5)
Lymphocytes Relative: 11 % — ABNORMAL LOW (ref 12–46)
Lymphs Abs: 1.4 10*3/uL (ref 0.7–4.0)
Neutro Abs: 10.7 10*3/uL — ABNORMAL HIGH (ref 1.7–7.7)

## 2011-05-25 LAB — PROTIME-INR: INR: 1.13 (ref 0.00–1.49)

## 2011-05-25 LAB — LIPASE, BLOOD: Lipase: 41 U/L (ref 11–59)

## 2011-05-25 LAB — LACTIC ACID, PLASMA: Lactic Acid, Venous: 1.2 mmol/L (ref 0.5–2.2)

## 2011-05-25 LAB — URINALYSIS, ROUTINE W REFLEX MICROSCOPIC
Leukocytes, UA: NEGATIVE
Nitrite: NEGATIVE
Protein, ur: NEGATIVE mg/dL

## 2011-05-25 LAB — CK TOTAL AND CKMB (NOT AT ARMC): Total CK: 19 U/L (ref 7–232)

## 2011-05-25 MED ORDER — IOHEXOL 300 MG/ML  SOLN
100.0000 mL | Freq: Once | INTRAMUSCULAR | Status: AC | PRN
  Administered 2011-05-25: 100 mL via INTRAVENOUS

## 2011-05-26 LAB — CBC
HCT: 35.1 % — ABNORMAL LOW (ref 39.0–52.0)
MCH: 30.1 pg (ref 26.0–34.0)
MCHC: 33.9 g/dL (ref 30.0–36.0)
RDW: 12.8 % (ref 11.5–15.5)

## 2011-05-26 LAB — URINE CULTURE: Colony Count: NO GROWTH

## 2011-05-26 LAB — BASIC METABOLIC PANEL
BUN: 18 mg/dL (ref 6–23)
Calcium: 9.1 mg/dL (ref 8.4–10.5)
GFR calc Af Amer: >60 mL/min (ref >60)
GFR calc non Af Amer: 58 mL/min — ABNORMAL LOW (ref >60)
Potassium: 4 mEq/L (ref 3.5–5.1)
Sodium: 134 mEq/L — ABNORMAL LOW (ref 135–145)

## 2011-05-26 LAB — LUPUS ANTICOAGULANT PANEL
DRVVT: 41.4 secs (ref 36.2–44.3)
Lupus Anticoagulant: NOT DETECTED
PTT Lupus Anticoagulant: 40.4 secs (ref 30.0–45.6)

## 2011-05-26 LAB — PROTEIN S, TOTAL: Protein S Ag, Total: 109 % (ref 60–150)

## 2011-05-26 LAB — PROTHROMBIN GENE MUTATION

## 2011-05-26 LAB — HOMOCYSTEINE: Homocysteine: 8.5 umol/L (ref 4.0–15.4)

## 2011-05-26 LAB — ANTITHROMBIN III: AntiThromb III Func: 104 % (ref 76–126)

## 2011-05-26 LAB — PROTEIN C, TOTAL: Protein C, Total: 100 % (ref 72–160)

## 2011-05-26 NOTE — H&P (Signed)
NAMEARHAN, MCMANAMON NO.:  [id number]  MEDICAL RECORD NO.:  [id number]  LOCATION:  WLED                         FACILITY:  [facility]  PHYSICIAN:  Almond Lint, MD       DATE OF BIRTH:  1946/10/19  DATE OF ADMISSION:  05/25/2011 DATE OF DISCHARGE:                             HISTORY & PHYSICAL   PRIMARY CARE PHYSICIAN:  Soyla Murphy. Renne Crigler, M.D.  HISTORY OF PRESENT ILLNESS:  Mr. Wilbon is a pleasant 65 year old gentleman who was recently admitted on June 25 for abdominal pain.  He was found to have evidence of pneumoperitoneum after developing some abdominal pain.  He was seen and evaluated by Dr. Corliss Skains for this and decision was made to take the patient to the operating room for urgent surgical intervention.  He was taken to the operating room and underwent exploratory laparotomy, found to have what appeared to be a very mild microperforation of the diverticulum in the descending colon that apparently had already sealed.  This was very mild.  There was no significant contamination.  Dr. Corliss Skains performed significant lavage and washout, and placed a little epiploic over the inflamed site as sort of a patch repair.  The patient following the procedure and after a short postoperative course, actually was able to go home in a stable condition.  However, since being home, he had a couple of days, but has gradually had worsening or diminished energy level with failure to thrive, poor appetite, decreased energy level.  He subsequently developed abdominal discomfort as well as some associated nausea.  He has vomited x1 and states this was brown in color, but cannot identify certain if there was any blood in his emesis.  He states that he has been moving his bowels and actually moved his bowels yesterday morning, although they seem to be small and firm, certainly not normal.  However, no blood was identified in those bowel movements either.  The patient also complains of right  lower extremity pain that has developed since being home.  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  However, he has not been on any chronic anticoagulation.  He contacted the on-call physician last evening who recommended the patient be seen either in the office if he could wait until the office opened or would have to come into the emergency department.  By later in the morning this morning, his discomfort was such that he felt he could not wait and therefore his wife brought him to the emergency department. Upon evaluation in the emergency department, he was found to have a significant right calf tenderness and therefore an ultrasound of the extremities is found evidence of DVT in the right lower extremity. Workup including labs show borderline or mildly abnormal results such as mild leukocytosis.  A CT scan of his abdomen and pelvis was ordered showing subtle inflammatory changes at the pylorus as well as some residual inflammatory changes of the sigmoid colon probably consistent with his prior surgery.  Otherwise, no evidence of obstruction was identified.  No abscess, free air or additional areas  inflammatory changes were identified.  We have been asked to evaluate the patient as his symptoms have not improved on medication and IV fluids in the emergency department.  PAST MEDICAL HISTORY:  Significant for hypertension, obstructive sleep apnea, history of prostate cancer, possible factor V Leiden deficiency.  SURGICAL HISTORY:  The patient had exploratory laparotomy as mentioned on May 10, 2011.  He also had robotic prostatectomy in 2007.  FAMILY HISTORY:  Noncontributory to present case.  SOCIAL HISTORY:  The patient is married.  He works as an Personnel officer. He denies any alcohol, tobacco or illicit drug use.  MEDICATIONS:  Include triamterene/hydrochlorothiazide, multivitamin  once daily and he was given a script for Augmentin 875 mg 1 tablet twice daily to take after discharge.  ALLERGIES:  No known drug or latex allergies.  REVIEW OF SYSTEMS:  Please see history of present illness for pertinent findings, otherwise complete 10 system review found negative.  PHYSICAL EXAMINATION:  GENERAL:  Reveals a fatigued 65 year old white gentleman; however, he does not appear in severe acute distress.  He is nontoxic appearing. VITAL SIGNS:  Show temperature of 98.5, heart rate of 84, blood pressure 128/76, respiratory rate of 16, and oxygen saturation 100%. ENT:  Unremarkable. NECK:  Supple without lymphadenopathy.  Trachea is midline.  No thyromegaly or masses. LUNGS:  Clear to auscultation.  No wheezes, rhonchi, or rales.  Normal respiratory effort without use of accessory muscles.  HEART:  Regular rate and rhythm without murmurs, gallops, or rubs.  Carotids 2+ and brisk without bruits.  Peripheral pulses intact and symmetrical. ABDOMEN:  Hypoactive bowel sounds, but is nondistended.  He is tender both in the right and left upper abdomen without evidence of peritonitis.  No hernias, mass effects or organomegaly is appreciated. His midline incision is healing well without evidence of superficial skin infection. RECTAL:  Deferred. GENITOURINARY:  Deferred. EXTREMITIES:  Good active range of motion of extremities without crepitus or pain with the exception of the right lower extremity, which is quite sore for him.  The patient has trace to 1+ edema and is quite tender with palpation of the posterior calf with a positive Homans' sign. SKIN:  Otherwise, warm and dry with good turgor.  No rashes, lesions, nodules noted. NEUROLOGIC:  The patient is alert and oriented x3.  DIAGNOSTICS:  CBC shows a white blood cell count of 13.7, hemoglobin of 13.5, hematocrit of 38.9, and platelet count of 247.  Metabolic panel shows a sodium of 132, potassium of 3.7, chloride of  96, CO2 of 23, BUN of 21, creatinine of 1.0, glucose of 124.  Lactic acid normal at 1.2. Urinalysis is negative.  Cardiac enzymes including troponin within normal limits.  PTT normal.  INR normal at 1.1.  IMAGING:  Plain films showed nonobstructive bowel gas pattern.  CT scan again shows inflammatory changes of the pylorus/duodenal bulb, but no evidence of surrounding fluid or evidence of ulcer disease.  There is some residual stranding anterior to loop of sigmoid colon, which could reflect mild diverticulitis versus postsurgical.  No drainable fluid collection, free air or abscesses are identified.  No other findings are seen.  IMPRESSION: 1. Gastritis with associated nausea, vomiting, and failure to thrive. 2. Right lower extremity acute deep vein thrombosis. 3. Mild diverticulitis, question ongoing versus resolving     postsurgical. 4. Hypertension.  PLAN:  We will admit the patient for observation and IV fluid resuscitation.  We will place the patient on therapeutic Lovenox 1 mg/kg twice daily.  We  will obtain a hypercoagulable panel prior to the start of his Lovenox.  We will keep the patient n.p.o. and at bowel rest.  We will put him on Protonix twice daily and Carafate regimen.  We will start the patient on Cipro and Flagyl as an antibiotic course.  We will ask the Medical Service to consult for this new finding of DVT and to help with decision-making whether this patient needs hematology consult for chronic anticoagulation therapy.     Brayton El, PA-C   ______________________________ Almond Lint, MD    KB/MEDQ  D:  05/25/2011  T:  05/25/2011  Job:  [id number]  Electronically Signed by Brayton El  on 05/26/2011 03:07:17 PM Electronically Signed by Almond Lint MD on 05/26/2011 03:31:30 PM

## 2011-05-27 ENCOUNTER — Inpatient Hospital Stay (HOSPITAL_COMMUNITY): Payer: 59

## 2011-05-27 LAB — FACTOR 5 LEIDEN

## 2011-05-27 LAB — BASIC METABOLIC PANEL
BUN: 15 mg/dL (ref 6–23)
Calcium: 8.7 mg/dL (ref 8.4–10.5)
Creatinine, Ser: 1.12 mg/dL (ref 0.50–1.35)
GFR calc Af Amer: >60 mL/min (ref >60)

## 2011-05-27 LAB — PROTIME-INR
INR: 1.19 (ref 0.00–1.49)
Prothrombin Time: 15.4 seconds — ABNORMAL HIGH (ref 11.6–15.2)

## 2011-05-27 LAB — CBC
MCH: 29.3 pg (ref 26.0–34.0)
MCHC: 33.5 g/dL (ref 30.0–36.0)
Platelets: 279 10*3/uL (ref 150–400)
RDW: 12.6 % (ref 11.5–15.5)

## 2011-05-28 ENCOUNTER — Other Ambulatory Visit: Payer: Self-pay | Admitting: Gastroenterology

## 2011-05-28 LAB — CBC
Hemoglobin: 11.8 g/dL — ABNORMAL LOW (ref 13.0–17.0)
MCH: 29.7 pg (ref 26.0–34.0)
MCV: 87.9 fL (ref 78.0–100.0)
Platelets: 266 10*3/uL (ref 150–400)
RBC: 3.97 MIL/uL — ABNORMAL LOW (ref 4.22–5.81)
WBC: 7.9 10*3/uL (ref 4.0–10.5)

## 2011-05-29 LAB — CBC
Hemoglobin: 11.7 g/dL — ABNORMAL LOW (ref 13.0–17.0)
Hemoglobin: 11.4 g/dL — ABNORMAL LOW (ref 13.0–17.0)
MCHC: 33.6 g/dL (ref 30.0–36.0)
MCHC: 34.7 g/dL (ref 30.0–36.0)
RBC: 3.96 MIL/uL — ABNORMAL LOW (ref 4.22–5.81)
WBC: 6.9 10*3/uL (ref 4.0–10.5)
WBC: 5.7 10*3/uL (ref 4.0–10.5)

## 2011-05-29 LAB — PROTIME-INR
INR: 1.58 — ABNORMAL HIGH (ref 0.00–1.49)
Prothrombin Time: 19.2 seconds — ABNORMAL HIGH (ref 11.6–15.2)

## 2011-05-29 LAB — GLUCOSE, CAPILLARY: Glucose-Capillary: 130 mg/dL — ABNORMAL HIGH (ref 70–99)

## 2011-05-30 LAB — GLUCOSE, CAPILLARY
Glucose-Capillary: 116 mg/dL — ABNORMAL HIGH (ref 70–99)
Glucose-Capillary: 120 mg/dL — ABNORMAL HIGH (ref 70–99)

## 2011-05-30 LAB — COMPREHENSIVE METABOLIC PANEL
AST: 21 U/L (ref 0–37)
Albumin: 2.5 g/dL — ABNORMAL LOW (ref 3.5–5.2)
Alkaline Phosphatase: 51 U/L (ref 39–117)
Chloride: 103 mEq/L (ref 96–112)
Potassium: 3.5 mEq/L (ref 3.5–5.1)
Total Bilirubin: 0.3 mg/dL (ref 0.3–1.2)

## 2011-05-30 LAB — DIFFERENTIAL
Basophils Absolute: 0 10*3/uL (ref 0.0–0.1)
Eosinophils Absolute: 0.3 10*3/uL (ref 0.0–0.7)
Eosinophils Relative: 6 % — ABNORMAL HIGH (ref 0–5)
Lymphocytes Relative: 24 % (ref 12–46)

## 2011-05-30 LAB — CBC
HCT: 32.8 % — ABNORMAL LOW (ref 39.0–52.0)
Platelets: 269 10*3/uL (ref 150–400)
RDW: 12.4 % (ref 11.5–15.5)
WBC: 5.5 10*3/uL (ref 4.0–10.5)

## 2011-05-30 LAB — PREALBUMIN: Prealbumin: 11 mg/dL — ABNORMAL LOW (ref 17.0–34.0)

## 2011-05-30 LAB — TRIGLYCERIDES: Triglycerides: 77 mg/dL (ref <150)

## 2011-05-31 DIAGNOSIS — K279 Peptic ulcer, site unspecified, unspecified as acute or chronic, without hemorrhage or perforation: Secondary | ICD-10-CM

## 2011-05-31 LAB — COMPREHENSIVE METABOLIC PANEL
ALT: 19 U/L (ref 0–53)
AST: 21 U/L (ref 0–37)
Alkaline Phosphatase: 50 U/L (ref 39–117)
CO2: 26 mEq/L (ref 19–32)
Chloride: 103 mEq/L (ref 96–112)
Creatinine, Ser: 0.99 mg/dL (ref 0.50–1.35)
GFR calc non Af Amer: >60 mL/min (ref >60)
Potassium: 3.5 mEq/L (ref 3.5–5.1)
Total Bilirubin: 0.3 mg/dL (ref 0.3–1.2)

## 2011-05-31 LAB — DIFFERENTIAL
Basophils Absolute: 0 10*3/uL (ref 0.0–0.1)
Lymphocytes Relative: 28 % (ref 12–46)
Monocytes Absolute: 0.8 10*3/uL (ref 0.1–1.0)
Monocytes Relative: 16 % — ABNORMAL HIGH (ref 3–12)
Neutro Abs: 2.5 10*3/uL (ref 1.7–7.7)

## 2011-05-31 LAB — GLUCOSE, CAPILLARY: Glucose-Capillary: 101 mg/dL — ABNORMAL HIGH (ref 70–99)

## 2011-05-31 LAB — CBC
HCT: 32.8 % — ABNORMAL LOW (ref 39.0–52.0)
Hemoglobin: 11.2 g/dL — ABNORMAL LOW (ref 13.0–17.0)
MCH: 29.4 pg (ref 26.0–34.0)
MCHC: 34.1 g/dL (ref 30.0–36.0)

## 2011-05-31 LAB — BETA-2-GLYCOPROTEIN I ABS, IGG/M/A: Beta-2 Glyco I IgG: 0 G Units (ref <20)

## 2011-05-31 LAB — PHOSPHORUS: Phosphorus: 3.2 mg/dL (ref 2.3–4.6)

## 2011-05-31 LAB — CHOLESTEROL, TOTAL: Cholesterol: 72 mg/dL (ref 0–200)

## 2011-05-31 LAB — CARDIOLIPIN ANTIBODIES, IGG, IGM, IGA
Anticardiolipin IgA: 5 APL U/mL — ABNORMAL LOW (ref <22)
Anticardiolipin IgM: 2 MPL U/mL — ABNORMAL LOW (ref <11)

## 2011-05-31 LAB — PROTIME-INR: INR: 1.37 (ref 0.00–1.49)

## 2011-06-01 LAB — GLUCOSE, CAPILLARY
Glucose-Capillary: 124 mg/dL — ABNORMAL HIGH (ref 70–99)
Glucose-Capillary: 140 mg/dL — ABNORMAL HIGH (ref 70–99)

## 2011-06-01 LAB — PROTIME-INR: Prothrombin Time: 15.4 seconds — ABNORMAL HIGH (ref 11.6–15.2)

## 2011-06-01 LAB — HEMOGLOBIN AND HEMATOCRIT, BLOOD
HCT: 31.8 % — ABNORMAL LOW (ref 39.0–52.0)
Hemoglobin: 10.9 g/dL — ABNORMAL LOW (ref 13.0–17.0)

## 2011-06-02 ENCOUNTER — Inpatient Hospital Stay (HOSPITAL_COMMUNITY): Payer: 59

## 2011-06-02 LAB — CBC
Hemoglobin: 9.6 g/dL — ABNORMAL LOW (ref 13.0–17.0)
Hemoglobin: 9.5 g/dL — ABNORMAL LOW (ref 13.0–17.0)
MCH: 29.7 pg (ref 26.0–34.0)
MCH: 30.2 pg (ref 26.0–34.0)
MCHC: 34 g/dL (ref 30.0–36.0)
MCV: 87.3 fL (ref 78.0–100.0)
Platelets: 212 10*3/uL (ref 150–400)
Platelets: 224 10*3/uL (ref 150–400)
RBC: 3.15 MIL/uL — ABNORMAL LOW (ref 4.22–5.81)
RDW: 12.6 % (ref 11.5–15.5)
WBC: 6.6 10*3/uL (ref 4.0–10.5)

## 2011-06-02 LAB — PROTIME-INR
INR: 1.31 (ref 0.00–1.49)
Prothrombin Time: 16.5 seconds — ABNORMAL HIGH (ref 11.6–15.2)
Prothrombin Time: 16.3 seconds — ABNORMAL HIGH (ref 11.6–15.2)

## 2011-06-02 LAB — GLUCOSE, CAPILLARY: Glucose-Capillary: 165 mg/dL — ABNORMAL HIGH (ref 70–99)

## 2011-06-02 LAB — HEMOGLOBIN AND HEMATOCRIT, BLOOD
HCT: 25.7 % — ABNORMAL LOW (ref 39.0–52.0)
Hemoglobin: 8.9 g/dL — ABNORMAL LOW (ref 13.0–17.0)

## 2011-06-02 LAB — H. PYLORI ANTIBODY, IGG: H Pylori IgG: 0.75 {ISR}

## 2011-06-02 MED ORDER — IOHEXOL 300 MG/ML  SOLN
50.0000 mL | Freq: Once | INTRAMUSCULAR | Status: AC | PRN
  Administered 2011-06-02: 20 mL via INTRAVENOUS

## 2011-06-03 LAB — PROTIME-INR: Prothrombin Time: 14.8 seconds (ref 11.6–15.2)

## 2011-06-03 LAB — GLUCOSE, CAPILLARY
Glucose-Capillary: 151 mg/dL — ABNORMAL HIGH (ref 70–99)
Glucose-Capillary: 131 mg/dL — ABNORMAL HIGH (ref 70–99)

## 2011-06-03 LAB — COMPREHENSIVE METABOLIC PANEL
ALT: 35 U/L (ref 0–53)
Albumin: 2.5 g/dL — ABNORMAL LOW (ref 3.5–5.2)
Alkaline Phosphatase: 44 U/L (ref 39–117)
BUN: 14 mg/dL (ref 6–23)
Chloride: 105 mEq/L (ref 96–112)
Glucose, Bld: 163 mg/dL — ABNORMAL HIGH (ref 70–99)
Potassium: 3.2 mEq/L — ABNORMAL LOW (ref 3.5–5.1)
Sodium: 136 mEq/L (ref 135–145)
Total Bilirubin: 0.2 mg/dL — ABNORMAL LOW (ref 0.3–1.2)

## 2011-06-03 LAB — CBC
Hemoglobin: 8.8 g/dL — ABNORMAL LOW (ref 13.0–17.0)
MCH: 29.8 pg (ref 26.0–34.0)
MCV: 87.8 fL (ref 78.0–100.0)
RBC: 2.95 MIL/uL — ABNORMAL LOW (ref 4.22–5.81)
WBC: 5.5 10*3/uL (ref 4.0–10.5)

## 2011-06-03 LAB — MAGNESIUM: Magnesium: 2 mg/dL (ref 1.5–2.5)

## 2011-06-04 DIAGNOSIS — E876 Hypokalemia: Secondary | ICD-10-CM

## 2011-06-04 LAB — CBC
HCT: 26.9 % — ABNORMAL LOW (ref 39.0–52.0)
MCH: 30 pg (ref 26.0–34.0)
MCHC: 33.8 g/dL (ref 30.0–36.0)
MCV: 88.8 fL (ref 78.0–100.0)
Platelets: 216 10*3/uL (ref 150–400)
RDW: 13.6 % (ref 11.5–15.5)
WBC: 5.7 10*3/uL (ref 4.0–10.5)

## 2011-06-04 LAB — GLUCOSE, CAPILLARY
Glucose-Capillary: 124 mg/dL — ABNORMAL HIGH (ref 70–99)
Glucose-Capillary: 98 mg/dL (ref 70–99)

## 2011-06-04 LAB — BASIC METABOLIC PANEL
BUN: 12 mg/dL (ref 6–23)
CO2: 27 mEq/L (ref 19–32)
Calcium: 8.5 mg/dL (ref 8.4–10.5)
Creatinine, Ser: 0.94 mg/dL (ref 0.50–1.35)
GFR calc non Af Amer: >60 mL/min (ref >60)
Glucose, Bld: 133 mg/dL — ABNORMAL HIGH (ref 70–99)

## 2011-06-05 LAB — GLUCOSE, CAPILLARY: Glucose-Capillary: 125 mg/dL — ABNORMAL HIGH (ref 70–99)

## 2011-06-05 LAB — HEMOGLOBIN AND HEMATOCRIT, BLOOD: Hemoglobin: 9.3 g/dL — ABNORMAL LOW (ref 13.0–17.0)

## 2011-06-05 LAB — PROTIME-INR: INR: 1.14 (ref 0.00–1.49)

## 2011-06-05 LAB — POTASSIUM: Potassium: 3.7 mEq/L (ref 3.5–5.1)

## 2011-06-06 LAB — BASIC METABOLIC PANEL WITH GFR
BUN: 11 mg/dL (ref 6–23)
CO2: 27 meq/L (ref 19–32)
Calcium: 8.8 mg/dL (ref 8.4–10.5)
Chloride: 104 meq/L (ref 96–112)
Creatinine, Ser: 1.01 mg/dL (ref 0.50–1.35)
GFR calc Af Amer: >60 mL/min
GFR calc non Af Amer: >60 mL/min
Glucose, Bld: 134 mg/dL — ABNORMAL HIGH (ref 70–99)
Potassium: 3.6 meq/L (ref 3.5–5.1)
Sodium: 135 meq/L (ref 135–145)

## 2011-06-06 LAB — CROSSMATCH: Unit division: 0

## 2011-06-06 LAB — GLUCOSE, CAPILLARY

## 2011-06-06 LAB — PROTIME-INR: INR: 1.1 (ref 0.00–1.49)

## 2011-06-07 LAB — CBC
HCT: 29.6 % — ABNORMAL LOW (ref 39.0–52.0)
MCH: 30.6 pg (ref 26.0–34.0)
MCV: 89.7 fL (ref 78.0–100.0)
Platelets: 223 10*3/uL (ref 150–400)
RBC: 3.3 MIL/uL — ABNORMAL LOW (ref 4.22–5.81)

## 2011-06-08 LAB — GLUCOSE, CAPILLARY: Glucose-Capillary: 87 mg/dL (ref 70–99)

## 2011-06-09 ENCOUNTER — Encounter (INDEPENDENT_AMBULATORY_CARE_PROVIDER_SITE_OTHER): Payer: 59 | Admitting: Surgery

## 2011-06-15 NOTE — Discharge Summary (Signed)
NAMEMAKAIL, WATLING NO.:  [id number]  MEDICAL RECORD NO.:  [id number]  LOCATION:  1501                         FACILITY:  [facility]  PHYSICIAN:  Ollen Gross. Vernell Morgans, M.D. DATE OF BIRTH:  1946/06/14  DATE OF ADMISSION:  05/25/2011 DATE OF DISCHARGE:  06/07/2011                              DISCHARGE SUMMARY   ADMITTING PHYSICIAN:  Dr. Almond Lint  DISCHARGING PHYSICIAN:  Dr. Chevis Pretty.  PRIMARY CARE PHYSICIAN:  Soyla Murphy. Renne Crigler, M.D.  PRIMARY SURGEON:  Dr. Karen Kitchens.  CONSULTANTS:  Dr. Carman Ching with Gastroenterology.  Dr. Carles Collet with Interventional Radiology.  PROCEDURES: 1. Upper endoscopy and biopsy by Dr. Carman Ching on May 28, 2011. 2. IVC filter placement by Dr. Elwanda Brooklyn on June 02, 2011.  REASON FOR ADMISSION:  [name] is a 65 year old male who was admitted on May 10, 2011, for abdominal pain.  He was found to have pneumoperitoneum and ultimately taken to the operating room for exploratory laparotomy with oversew of sigmoid perforation.  The patient did well postoperatively and was discharged home in stable condition on May 18, 2011.  After several days being at home, the patient ultimately developed worsening abdominal discomfort with associated nausea and vomiting.  He had been moving his bowels otherwise.  He did notice that his stool was dark in nature and on the black side.  The patient also complained of some lower extremity pain.  At that time, we were informed the patient did have a history of factor V Leiden insufficiency and had a left lower extremity DVT in the past for which he was treated with Coumadin.  Upon arrival to the emergency department, the patient did have an ultrasound of his right lower extremity which revealed a DVT. He also had a CT scan of the abdomen which showed subtle inflammatory changes of the pylorus as well as some residual inflammatory changes of the sigmoid colon consistent with his prior  surgery.  At this time, the patient was admitted.  Please see admitting history and physical for further details.  ADMITTING DIAGNOSES: 1. Gastritis associated nausea, vomiting and failure to thrive. 2. New right lower extremity deep vein thrombosis. 3. Mild diverticulitis status post exploratory laparotomy with oversew     of sigmoid perforation. 4. Hypertension. 5. History of DVT.  HOSPITAL COURSE:  At this time, the patient was admitted.  A hypercoagulable panel was done which revealed no evidence of factor V Leiden deficiency.  Because of the patient's DVT, he was placed on therapeutic Lovenox.  He was placed on some antibiotics for mild diverticulitis.  Over the first couple of days, the patient continued to have nausea and vomiting with upper abdominal pain.  Because of this, an upper GI was done.  This revealed an ulceration and duodenitis involving the proximal second portion of the duodenum.  Therefore, Gastroenterology was consulted.  The following day, the patient was taken to the endoscopy suite where he had an EGD done.  This revealed severe duodenal ulcerations with a high likelihood of bleeding. Biopsies were obtained to rule out any type of malignancy.  The biopsies did come back and showed duodenal ulcers and ulcerations with no  malignancy seen.  The patient was continued on Lovenox despite these ulcers as he did not have any evidence of bleeding.  He was placed on TNA secondary to n.p.o. status.  After several days once the patient's nausea and vomiting ceased, the patient was tried on clear liquid diet. Later that night the patient began having hematemesis with bright red blood.  His Lovenox was stopped.  His hemoglobin had trended down approximately 1.5 g.  Secondary to the patient's risk of bleeding continuously from his duodenal ulcer, Interventional Radiology was asked to evaluate the patient for placement of IVC filter.  At this time, a permanent IVC filter  was placed as the patient had a recent of DVTs as well as a  recent DVT and due to his likelihood of bleeding, they felt a permanent IVC filter was appropriate.  After the IVC filter was placed, the patient was started back on clear liquids.  At this time, the patient was not having any abdominal pain and no further nausea and vomiting.  He was continued on his Protonix drip.  Over the next several days, the patient's diet was able to be advanced as tolerated.  His TNA was gleaned and his Protonix trip was changed to Protonix 40 mg IV b.i.d.  By June 07, 2011, the patient was tolerating a regular diet.  He had had no further bleeding and his hemoglobin was stable and had actually gone back up to 10.1.  He was changed to p.o. Protonix b.i.d. and was otherwise felt stable for discharge home at this time.  DISCHARGE DIAGNOSES: 1. New acute right lower extremity deep vein thrombosis with new     inferior vena cava filter 2. Severe duodenal ulcers. 3. Upper gastrointestinal bleed secondary to anticoagulation and     duodenal ulcers, now resolved. 4. Acute blood loss anemia which is stable. 5. Mild diverticulitis status post laparotomy with oversew of sigmoid     perforation. 6. Hypertension. 7. No history of factor V Leiden deficiency.  DISCHARGE MEDICATIONS:  The patient may resume home medications including triamterene/hydrochlorothiazide.  He is given new prescription for Percocet 5/325 one to two p.o. q.4 h p.r.n. pain.  He may continue his multivitamins but he is to stop his amoxicillin.  He will be given new prescription for Protonix 40 mg p.o. b.i.d.  DISCHARGE INSTRUCTIONS:  The patient may increase his activity slowly and walk up steps.  He still has a lifting restriction for 2 more weeks from his recent laparotomy.  He is to resume his low fiber diet.  He is to follow up with Dr. Corliss Skains in our office in 2 to 3 weeks.  He is to follow up with Dr. Randa Evens in his office in 2  weeks.  He is to call for bleeding or worsening pain.     Letha Cape, PA   ______________________________ Ollen Gross. Vernell Morgans, M.D.    KEO/MEDQ  D:  06/07/2011  T:  06/07/2011  Job:  [id number]  cc:   Fayrene Fearing L. Malon Kindle., M.D. Fax: [phone]  Wilmon Arms. Corliss Skains, M.D. [address] [place] [address] [place] [address] [place] D. Renne Crigler, M.D. Fax: [phone]  Electronically Signed by Barnetta Chapel PA on 06/08/2011 01:20:24 PM Electronically Signed by Chevis Pretty III M.D. on 06/15/2011 03:54:16 AM

## 2011-06-28 ENCOUNTER — Encounter (INDEPENDENT_AMBULATORY_CARE_PROVIDER_SITE_OTHER): Payer: Self-pay | Admitting: Surgery

## 2011-06-28 ENCOUNTER — Ambulatory Visit (INDEPENDENT_AMBULATORY_CARE_PROVIDER_SITE_OTHER): Payer: 59 | Admitting: Surgery

## 2011-06-28 VITALS — Temp 98.2°F | Wt 212.0 lb

## 2011-06-28 DIAGNOSIS — K5732 Diverticulitis of large intestine without perforation or abscess without bleeding: Secondary | ICD-10-CM

## 2011-06-28 DIAGNOSIS — I824Z9 Acute embolism and thrombosis of unspecified deep veins of unspecified distal lower extremity: Secondary | ICD-10-CM

## 2011-06-28 DIAGNOSIS — K5720 Diverticulitis of large intestine with perforation and abscess without bleeding: Secondary | ICD-10-CM

## 2011-06-28 DIAGNOSIS — K279 Peptic ulcer, site unspecified, unspecified as acute or chronic, without hemorrhage or perforation: Secondary | ICD-10-CM

## 2011-06-28 NOTE — Progress Notes (Signed)
This patient was seen emergently on June 25 with free intraperitoneal air. At that time I explored him and found that he had a single perforated sigmoid diverticulum. There was minimal contamination. We treated him with irrigation and placement of a pelvic drain. He was discharged home on July 3 after removing the drain. The patient was readmitted to the hospital on July 10 with gastritis, right lower extremity DVT. The patient underwent anticoagulation but then developed hematemesis. EGD showed a duodenal ulcer. He then had an IVC filter placed and his anti-coagulation was stopped. He is now doing much better. Appetite is good. He has a little bit of soreness around his incision. He is having regular daily bowel movements. He recently saw Dr. Bosie Clos for followup of his duodenal ulcer. Apparently an EGD is planned in the near future. The patient last had a colonoscopy in 2010.  On examination he has normal active bowel sounds. His incision is well healed with no sign of infection or hernia. Minimal soreness around his incision. No other abdominal pain. He has a little bit of swelling in his right lower shunt he is wearing compression stockings. He is having some right heel pain but I did not think that this is related to his DVT. This made be some component of plantar fasciitis.  Impression: 1. Diverticulitis status post small perforation-there seems to be much improved. Given his other current issues, I don't think that there are any urgent indications for an elective sigmoid resection. If he begins to experience recurrent episodes of diverticulitis, then I would recommend an elective sigmoid resection. 2. Duodenal ulcer-managed by Dr. Bosie Clos. Hold anti-coagulation. We will await the repeat EGD. 3. Right lower extremity DVT-I gave him a prescription for compression stockings. If his heel pain continues, I encouraged him to speak with his primary care physician about a possible orthopedic referral.  At  this point, we will allow Dr. Bosie Clos to manage his ulcer disease and diverticulitis. If he begins to have recurrent symptoms of diverticulitis, then I would be glad to see him back to discuss elective sigmoid resection. Followup on a p.r.n. basis.

## 2011-06-28 NOTE — Patient Instructions (Signed)
Resume high-fiber diet.  GI management per Dr. Bosie Clos

## 2011-07-08 NOTE — Op Note (Signed)
  NAMEDONTE, LENZO NO.:  [id number]  MEDICAL RECORD NO.:  [id number]  LOCATION:  1501                         FACILITY:  [facility]  PHYSICIAN:  Fayrene Fearing L. Malon Kindle., M.D.DATE OF BIRTH:  10-16-1946  DATE OF PROCEDURE:  05/28/2011 DATE OF DISCHARGE:                              OPERATIVE REPORT   PROCEDURE:  Esophagogastroduodenoscopy and biopsy.  MEDICATIONS:  Cetacaine spray, fentanyl 50 mcg, and Versed 5 mg IV.  INDICATIONS:  The patient was admitted with abdominal pain and extremity pain, following an oversew of a perforated diverticulum couple weeks prior.  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  CT of the abdomen showed severe duodenal ulceration and inflammation without clear abscess.  The patient has been on Lovenox and Coumadin.  This procedure is done to evaluate for the possibility of bleeding, etc.  DESCRIPTION OF PROCEDURE:  Procedure explained to the patient, consent obtained, in left lateral decubitus position, the Pentax upper scope was inserted and advanced under direct visualization.  The scope was inserted and advanced.  The esophagus was completely normal, no varices, significant esophagitis, etc.  The stomach was entered and was completely normal.  The pylorus identified and past.  There was a small amount of inflammation right around the pyloric channel.  Immediately, upon entering the duodenal bulb, it was markedly edematous and severely ulcerated with lumen nearly obscured with big angry appearing islands of engorged edematous mucosa that was hard with marked severe ulceration. Couple of biopsies at the edge were taken.  This did not have the appearance of a normal ulcer process.  I was quite hesitant to attempt to pass the scope through this area and it started in the entire bulb down into the junction of the bulb and second portion which was so edematous and swollen and near the lumen was barely  detectable.  The scope was withdrawn.  The original findings were confirmed.  ASSESSMENT:  Severe ulcerative and infiltrative process of the duodenum. The considerations here are whether or not this is a benign process which I think is somewhat unlikely, malignant, or possibly ischemic since the patient has known to have  factor v Leiden deficiency and could have ended up with an ischemic injury to his duodenum.  I am concerned about keeping him on Coumadin now since he is at high risk of bleeding and may in fact need surgery.  PLAN:  Will check path, will keep him n.p.o., and I will keep him on Lovenox and hold his Coumadin for now.  We may need to get him to see.          ______________________________ Llana Aliment Malon Kindle., M.D.     Waldron Session  D:  05/28/2011  T:  05/28/2011  Job:  [id number]  cc:   Soyla Murphy. Renne Crigler, M.D. Fax: [phone]  Almond Lint, MD [address] [place] [address]  Electronically Signed by Carman Ching M.D. on 07/08/2011 11:30:51 AM

## 2011-07-08 NOTE — Consult Note (Signed)
[name], [name] NO.:  [id number]  MEDICAL RECORD NO.:  [id number]  LOCATION:  1501                         FACILITY:  [facility]  PHYSICIAN:  Fayrene Fearing L. Malon Kindle., M.D.DATE OF BIRTH:  13-Aug-1946  DATE OF CONSULTATION:  05/27/2011 DATE OF DISCHARGE:                                CONSULTATION   Gastroenterology Consultation.  REQUESTED BY:  [facility].  PRIMARY CARE PHYSICIAN:  Soyla Murphy. Renne Crigler, M.D.  INDICATION:  Patient is a nice 65 year old gentleman, who has had previous colonoscopy 2-3 years ago with the finding of several small diverticula.  He presented urgently to the Emergency Room on May 10, 2011 for abdominal pain and was found to have pneumoperitoneum.  He went to the operating room and small exploratory laparotomy had a very mild small perforation of a single diverticulum in the descending colon that had already sealed.  The area was lavaged and an epiploic patch was placed over the inflamed site.  He was able to go home a few days later with no problems.  He came back in approximately 2 weeks later with right lower extremity pain and upper abdominal pain.  He states that after going home, he had severe burning in his epigastrium.  He denies taking any nonsteroidal medications.  He has a history in the past of factor V Leiden deficiency and has had previous DVTs treated with anticoagulants in the past, but had not been taking anything chronically.  He was found to have a DVT in is right lower extremity and the CT scan of the abdomen showed severe pyloric and duodenal changes consistent with ulceration.  There was no abscess.  The patient is currently on Coumadin just started yesterday and Lovenox for his DVT. His abdominal pain is feeling somewhat better.  CT showed a severely inflamed possibly ulcerated second duodenum involved and the issues whether or not he will be able to be anticoagulated.  He has not had any bleeding.  He  does feel little bit better today.  MEDICATIONS ON ADMISSION: 1. Dyazide. 2. Vitamins. 3. Augmentin, which was started on recent discharge.  ALLERGIES:  He has no drug allergies.  CURRENT HOSPITAL MEDICATIONS:  Include: 1. Coumadin. 2. Lovenox. 3. Cipro. 4. Metronidazole. 5. Benadryl. 6. Zofran. 7. As well as morphine.  PAST MEDICAL HISTORY: 1. 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  The exact workup     for this is unclear. 2. DVT in the past and current DVT of the right lower extremity. 3. History of sleep apnea. 4. History of prostate cancer, status post robotic prostatectomy.  PAST SURGICAL HISTORY: 1. The robotic prostatectomy in 2007. 2. Exploratory laparotomy as noted above.  FAMILY HISTORY:  Noncontributory.  SOCIAL HISTORY:  He is an Personnel officer.  Does not smoke, drink and is married.  PHYSICAL EXAMINATION:  VITAL SIGNS:  Temperature 97.9, pulse 77, blood pressure 121/80. GENERAL:  Pleasant white male, in no acute distress. EYES:  Sclerae nonicteric. LUNGS:  Clear. HEART:  Regular rate and rhythm without murmurs, rubs or gallops. ABDOMEN:  Nondistended and soft with very moderate epigastric tenderness.  PERTINENT DATA:  CT scan shows marked inflammation in the region of  the duodenal bulb with upper GI done today showing ulcerations, severe inflammation of the proximal second duodenum.  ASSESSMENT: 1. Marked ulceration of the duodenum in the bulb and second portion.     Hopefully, this is simply stress-related ulcer disease.     Unfortunately, he is in need of anticoagulation due to his deep     venous thrombosis and will be a risk of bleeding.  I agree this     area should be looked at endoscopically to look for visible vessels     and other signs of active bleeding.  This may affect why and how     aggressively to anticoagulate. 2. Deep venous thrombosis of the right lower extremity with a history     of possible factor V Leiden  deficiency. 3. Recent surgery for perforation of the diverticulum with epiploic     patch.  PLAN:  We will proceed with endoscopy tomorrow.  We will hold Lovenox in the morning and hold Coumadin until we are able to evaluate this area more thoroughly.  I have explained this to the patient.          ______________________________ Llana Aliment. Malon Kindle., M.D.     Waldron Session  D:  05/27/2011  T:  05/27/2011  Job:  [id number]  cc:   Almond Lint, MD [address] [place] [address]  Soyla Murphy. Renne Crigler, M.D. Fax: [phone]  Llana Aliment. Malon Kindle., M.D. Fax: [phone]  Electronically Signed by Carman Ching M.D. on 07/08/2011 11:30:40 AM

## 2011-07-26 ENCOUNTER — Other Ambulatory Visit: Payer: Self-pay | Admitting: Gastroenterology

## 2011-08-06 ENCOUNTER — Other Ambulatory Visit: Payer: Self-pay | Admitting: Gastroenterology

## 2011-08-12 ENCOUNTER — Ambulatory Visit
Admission: RE | Admit: 2011-08-12 | Discharge: 2011-08-12 | Disposition: A | Discharge status: Home / Self Care | Payer: 59 | Source: Ambulatory Visit | Attending: Gastroenterology | Admitting: Gastroenterology

## 2011-08-16 ENCOUNTER — Encounter: Payer: Self-pay | Admitting: Surgery

## 2011-10-31 ENCOUNTER — Ambulatory Visit: Payer: 59

## 2011-10-31 DIAGNOSIS — H6120 Impacted cerumen, unspecified ear: Secondary | ICD-10-CM

## 2012-04-09 ENCOUNTER — Ambulatory Visit: Payer: 59 | Admitting: Physician Assistant

## 2012-04-09 VITALS — BP 152/82 | HR 93 | Temp 99.1°F | Resp 18 | Ht 69.25 in | Wt 219.8 lb

## 2012-04-09 DIAGNOSIS — K1379 Other lesions of oral mucosa: Secondary | ICD-10-CM

## 2012-04-09 DIAGNOSIS — R7989 Other specified abnormal findings of blood chemistry: Secondary | ICD-10-CM

## 2012-04-09 DIAGNOSIS — R509 Fever, unspecified: Secondary | ICD-10-CM

## 2012-04-09 DIAGNOSIS — J029 Acute pharyngitis, unspecified: Secondary | ICD-10-CM

## 2012-04-09 DIAGNOSIS — K1370 Unspecified lesions of oral mucosa: Secondary | ICD-10-CM

## 2012-04-09 DIAGNOSIS — Z131 Encounter for screening for diabetes mellitus: Secondary | ICD-10-CM

## 2012-04-09 LAB — POCT CBC
Granulocyte percent: 71.8 %G (ref 37–80)
HCT, POC: 45.3 % (ref 43.5–53.7)
MCV: 87.8 fL (ref 80–97)
MID (cbc): 0.7 (ref 0–0.9)
POC Granulocyte: 6.5 (ref 2–6.9)
Platelet Count, POC: 222 10*3/uL (ref 142–424)
RBC: 5.16 M/uL (ref 4.69–6.13)

## 2012-04-09 LAB — GLUCOSE, POCT (MANUAL RESULT ENTRY): POC Glucose: 134 mg/dl — AB (ref 70–99)

## 2012-04-09 LAB — POCT RAPID STREP A (OFFICE): Rapid Strep A Screen: NEGATIVE

## 2012-04-09 MED ORDER — ACETAMINOPHEN 325 MG PO TABS
1000.0000 mg | ORAL_TABLET | Freq: Once | ORAL | Status: AC
  Administered 2012-04-09: 975 mg via ORAL

## 2012-04-09 MED ORDER — TRIAMCINOLONE ACETONIDE 40 MG/ML IJ SUSP
40.0000 mg | Freq: Once | INTRAMUSCULAR | Status: AC
  Administered 2012-04-09: 40 mg via INTRAMUSCULAR

## 2012-04-09 MED ORDER — MAGIC MOUTHWASH W/LIDOCAINE: 5.0000 mL | Freq: Four times a day (QID) | ORAL | Status: DC | PRN

## 2012-04-09 NOTE — Progress Notes (Signed)
  Subjective:    Patient ID: THAXTON [name], male    DOB: [birth year], 66 y.o.   MRN: [id number]  HPI 66 yo cm c/o 1-2 day h/o severe ST.  Low grade temp here. No body aches.  No cough.  Throat feels very swollen.  Pt. States Dr. Norma Fredrickson took him off BP meds bc BP had normalized after after a surgery last year.   Review of Systems  All other systems reviewed and are negative.       Objective:   Physical Exam  Nursing note and vitals reviewed. Constitutional: He is oriented to person, place, and time. He appears well-developed and well-nourished.  HENT:  Head: Normocephalic and atraumatic.  Right Ear: External ear normal.  Mouth/Throat: No oropharyngeal exudate (no exudate, but +erythema, uvula is edematous but midline).  Neck: Normal range of motion. Neck supple.  Cardiovascular: Normal rate, regular rhythm and normal heart sounds.   Pulmonary/Chest: Effort normal and breath sounds normal.  Lymphadenopathy:    He has no cervical adenopathy.  Neurological: He is alert and oriented to person, place, and time.  Skin: Skin is warm and dry.   Results for orders placed in visit on 04/09/12  POCT CBC      Component Value Range   WBC 9.1  4.6 - 10.2 (K/uL)   Lymph, poc 1.8  0.6 - 3.4    POC LYMPH PERCENT 20.0  10 - 50 (%L)   MID (cbc) 0.7  0 - 0.9    POC MID % 8.2  0 - 12 (%M)   POC Granulocyte 6.5  2 - 6.9    Granulocyte percent 71.8  37 - 80 (%G)   RBC 5.16  4.69 - 6.13 (M/uL)   Hemoglobin 15.1  14.1 - 18.1 (g/dL)   HCT, POC 29.5  28.4 - 53.7 (%)   MCV 87.8  80 - 97 (fL)   MCH, POC 29.3  27 - 31.2 (pg)   MCHC 33.3  31.8 - 35.4 (g/dL)   RDW, POC 13.2     Platelet Count, POC 222  142 - 424 (K/uL)   MPV 8.9  0 - 99.8 (fL)  POCT RAPID STREP A (OFFICE)      Component Value Range   Rapid Strep A Screen Negative  Negative   GLUCOSE, POCT (MANUAL RESULT ENTRY)      Component Value Range   POC Glucose 134 (*) 70 - 99 (mg/dl)   (patient ate a biscuit for breakfast)    Assessment  & Plan:  Pharyngitis with uvula swelling-Kenalog to reduce uvula swelling. MMW. Recheck BP OOO when well. Check FBG when well.

## 2012-09-28 DIAGNOSIS — S99929A Unspecified injury of unspecified foot, initial encounter: Secondary | ICD-10-CM | POA: Diagnosis not present

## 2012-09-28 DIAGNOSIS — S8990XA Unspecified injury of unspecified lower leg, initial encounter: Secondary | ICD-10-CM | POA: Diagnosis not present

## 2012-09-28 DIAGNOSIS — W19XXXA Unspecified fall, initial encounter: Secondary | ICD-10-CM | POA: Diagnosis not present

## 2012-09-28 DIAGNOSIS — M25569 Pain in unspecified knee: Secondary | ICD-10-CM | POA: Diagnosis not present

## 2013-02-22 DIAGNOSIS — Z8711 Personal history of peptic ulcer disease: Secondary | ICD-10-CM | POA: Diagnosis not present

## 2013-02-22 DIAGNOSIS — K263 Acute duodenal ulcer without hemorrhage or perforation: Secondary | ICD-10-CM | POA: Diagnosis not present

## 2013-08-03 DIAGNOSIS — H6120 Impacted cerumen, unspecified ear: Secondary | ICD-10-CM | POA: Diagnosis not present

## 2013-08-15 DIAGNOSIS — IMO0002 Reserved for concepts with insufficient information to code with codable children: Secondary | ICD-10-CM | POA: Diagnosis not present

## 2013-08-15 DIAGNOSIS — I10 Essential (primary) hypertension: Secondary | ICD-10-CM | POA: Diagnosis not present

## 2013-08-31 DIAGNOSIS — H25019 Cortical age-related cataract, unspecified eye: Secondary | ICD-10-CM | POA: Diagnosis not present

## 2013-08-31 DIAGNOSIS — H02839 Dermatochalasis of unspecified eye, unspecified eyelid: Secondary | ICD-10-CM | POA: Diagnosis not present

## 2013-08-31 DIAGNOSIS — H18419 Arcus senilis, unspecified eye: Secondary | ICD-10-CM | POA: Diagnosis not present

## 2013-08-31 DIAGNOSIS — N393 Stress incontinence (female) (male): Secondary | ICD-10-CM | POA: Diagnosis not present

## 2013-08-31 DIAGNOSIS — H25049 Posterior subcapsular polar age-related cataract, unspecified eye: Secondary | ICD-10-CM | POA: Diagnosis not present

## 2013-08-31 DIAGNOSIS — N529 Male erectile dysfunction, unspecified: Secondary | ICD-10-CM | POA: Diagnosis not present

## 2013-08-31 DIAGNOSIS — C61 Malignant neoplasm of prostate: Secondary | ICD-10-CM | POA: Diagnosis not present

## 2013-08-31 DIAGNOSIS — H2510 Age-related nuclear cataract, unspecified eye: Secondary | ICD-10-CM | POA: Diagnosis not present

## 2013-10-01 DIAGNOSIS — H2510 Age-related nuclear cataract, unspecified eye: Secondary | ICD-10-CM | POA: Diagnosis not present

## 2013-10-01 DIAGNOSIS — H269 Unspecified cataract: Secondary | ICD-10-CM | POA: Diagnosis not present

## 2013-10-02 DIAGNOSIS — H2510 Age-related nuclear cataract, unspecified eye: Secondary | ICD-10-CM | POA: Diagnosis not present

## 2013-10-22 DIAGNOSIS — H269 Unspecified cataract: Secondary | ICD-10-CM | POA: Diagnosis not present

## 2013-10-22 DIAGNOSIS — H2510 Age-related nuclear cataract, unspecified eye: Secondary | ICD-10-CM | POA: Diagnosis not present

## 2013-10-22 DIAGNOSIS — I10 Essential (primary) hypertension: Secondary | ICD-10-CM | POA: Diagnosis not present

## 2013-11-13 DIAGNOSIS — R799 Abnormal finding of blood chemistry, unspecified: Secondary | ICD-10-CM | POA: Diagnosis not present

## 2014-04-26 DIAGNOSIS — J020 Streptococcal pharyngitis: Secondary | ICD-10-CM | POA: Diagnosis not present

## 2014-05-14 DIAGNOSIS — I10 Essential (primary) hypertension: Secondary | ICD-10-CM | POA: Diagnosis not present

## 2014-05-14 DIAGNOSIS — K219 Gastro-esophageal reflux disease without esophagitis: Secondary | ICD-10-CM | POA: Diagnosis not present

## 2014-05-14 DIAGNOSIS — J029 Acute pharyngitis, unspecified: Secondary | ICD-10-CM | POA: Diagnosis not present

## 2014-09-06 DIAGNOSIS — N5231 Erectile dysfunction following radical prostatectomy: Secondary | ICD-10-CM | POA: Diagnosis not present

## 2014-09-06 DIAGNOSIS — Z8546 Personal history of malignant neoplasm of prostate: Secondary | ICD-10-CM | POA: Diagnosis not present

## 2014-09-06 DIAGNOSIS — N393 Stress incontinence (female) (male): Secondary | ICD-10-CM | POA: Diagnosis not present

## 2014-11-18 DIAGNOSIS — Z961 Presence of intraocular lens: Secondary | ICD-10-CM | POA: Diagnosis not present

## 2015-01-06 DIAGNOSIS — K219 Gastro-esophageal reflux disease without esophagitis: Secondary | ICD-10-CM | POA: Diagnosis not present

## 2015-01-06 DIAGNOSIS — I10 Essential (primary) hypertension: Secondary | ICD-10-CM | POA: Diagnosis not present

## 2015-01-06 DIAGNOSIS — Z125 Encounter for screening for malignant neoplasm of prostate: Secondary | ICD-10-CM | POA: Diagnosis not present

## 2015-01-24 DIAGNOSIS — I4510 Unspecified right bundle-branch block: Secondary | ICD-10-CM | POA: Diagnosis not present

## 2015-01-24 DIAGNOSIS — I10 Essential (primary) hypertension: Secondary | ICD-10-CM | POA: Diagnosis not present

## 2015-01-27 DIAGNOSIS — R0602 Shortness of breath: Secondary | ICD-10-CM | POA: Diagnosis not present

## 2015-02-13 DIAGNOSIS — M545 Low back pain: Secondary | ICD-10-CM | POA: Diagnosis not present

## 2015-02-20 DIAGNOSIS — I10 Essential (primary) hypertension: Secondary | ICD-10-CM | POA: Diagnosis not present

## 2015-02-20 DIAGNOSIS — R9431 Abnormal electrocardiogram [ECG] [EKG]: Secondary | ICD-10-CM | POA: Diagnosis not present

## 2015-02-20 DIAGNOSIS — I4510 Unspecified right bundle-branch block: Secondary | ICD-10-CM | POA: Diagnosis not present

## 2015-02-28 DIAGNOSIS — I4510 Unspecified right bundle-branch block: Secondary | ICD-10-CM | POA: Diagnosis not present

## 2015-02-28 DIAGNOSIS — I10 Essential (primary) hypertension: Secondary | ICD-10-CM | POA: Diagnosis not present

## 2015-06-16 DIAGNOSIS — H6120 Impacted cerumen, unspecified ear: Secondary | ICD-10-CM | POA: Diagnosis not present

## 2015-06-16 DIAGNOSIS — H9209 Otalgia, unspecified ear: Secondary | ICD-10-CM | POA: Diagnosis not present

## 2015-09-12 DIAGNOSIS — Z8546 Personal history of malignant neoplasm of prostate: Secondary | ICD-10-CM | POA: Diagnosis not present

## 2015-09-12 DIAGNOSIS — N5201 Erectile dysfunction due to arterial insufficiency: Secondary | ICD-10-CM | POA: Diagnosis not present

## 2015-09-12 DIAGNOSIS — N393 Stress incontinence (female) (male): Secondary | ICD-10-CM | POA: Diagnosis not present

## 2016-01-05 DIAGNOSIS — Z125 Encounter for screening for malignant neoplasm of prostate: Secondary | ICD-10-CM | POA: Diagnosis not present

## 2016-01-05 DIAGNOSIS — I10 Essential (primary) hypertension: Secondary | ICD-10-CM | POA: Diagnosis not present

## 2016-01-05 DIAGNOSIS — Z0001 Encounter for general adult medical examination with abnormal findings: Secondary | ICD-10-CM | POA: Diagnosis not present

## 2016-01-05 DIAGNOSIS — Z13220 Encounter for screening for lipoid disorders: Secondary | ICD-10-CM | POA: Diagnosis not present

## 2016-05-04 DIAGNOSIS — H6123 Impacted cerumen, bilateral: Secondary | ICD-10-CM | POA: Diagnosis not present

## 2016-05-04 DIAGNOSIS — H6983 Other specified disorders of Eustachian tube, bilateral: Secondary | ICD-10-CM | POA: Diagnosis not present

## 2016-05-27 DIAGNOSIS — H6981 Other specified disorders of Eustachian tube, right ear: Secondary | ICD-10-CM | POA: Diagnosis not present

## 2016-05-27 DIAGNOSIS — J302 Other seasonal allergic rhinitis: Secondary | ICD-10-CM | POA: Diagnosis not present

## 2016-09-15 DIAGNOSIS — N393 Stress incontinence (female) (male): Secondary | ICD-10-CM | POA: Diagnosis not present

## 2016-09-15 DIAGNOSIS — N5201 Erectile dysfunction due to arterial insufficiency: Secondary | ICD-10-CM | POA: Diagnosis not present

## 2016-09-15 DIAGNOSIS — Z8546 Personal history of malignant neoplasm of prostate: Secondary | ICD-10-CM | POA: Diagnosis not present

## 2017-01-17 DIAGNOSIS — I10 Essential (primary) hypertension: Secondary | ICD-10-CM | POA: Diagnosis not present

## 2017-01-17 DIAGNOSIS — Z0000 Encounter for general adult medical examination without abnormal findings: Secondary | ICD-10-CM | POA: Diagnosis not present

## 2017-01-17 DIAGNOSIS — Z13220 Encounter for screening for lipoid disorders: Secondary | ICD-10-CM | POA: Diagnosis not present

## 2017-01-17 DIAGNOSIS — Z125 Encounter for screening for malignant neoplasm of prostate: Secondary | ICD-10-CM | POA: Diagnosis not present

## 2017-02-22 DIAGNOSIS — B078 Other viral warts: Secondary | ICD-10-CM | POA: Diagnosis not present

## 2017-02-22 DIAGNOSIS — D225 Melanocytic nevi of trunk: Secondary | ICD-10-CM | POA: Diagnosis not present

## 2017-05-11 DIAGNOSIS — Z9989 Dependence on other enabling machines and devices: Secondary | ICD-10-CM | POA: Diagnosis not present

## 2017-05-11 DIAGNOSIS — G4733 Obstructive sleep apnea (adult) (pediatric): Secondary | ICD-10-CM | POA: Diagnosis not present

## 2017-05-25 DIAGNOSIS — G4733 Obstructive sleep apnea (adult) (pediatric): Secondary | ICD-10-CM | POA: Diagnosis not present

## 2017-07-26 DIAGNOSIS — Z9989 Dependence on other enabling machines and devices: Secondary | ICD-10-CM | POA: Diagnosis not present

## 2017-07-26 DIAGNOSIS — Z23 Encounter for immunization: Secondary | ICD-10-CM | POA: Diagnosis not present

## 2017-07-26 DIAGNOSIS — G4733 Obstructive sleep apnea (adult) (pediatric): Secondary | ICD-10-CM | POA: Diagnosis not present

## 2017-09-09 DIAGNOSIS — C61 Malignant neoplasm of prostate: Secondary | ICD-10-CM | POA: Diagnosis not present

## 2017-09-16 DIAGNOSIS — Z8546 Personal history of malignant neoplasm of prostate: Secondary | ICD-10-CM | POA: Diagnosis not present

## 2017-09-16 DIAGNOSIS — N5201 Erectile dysfunction due to arterial insufficiency: Secondary | ICD-10-CM | POA: Diagnosis not present

## 2017-09-16 DIAGNOSIS — N393 Stress incontinence (female) (male): Secondary | ICD-10-CM | POA: Diagnosis not present

## 2017-09-22 DIAGNOSIS — G4733 Obstructive sleep apnea (adult) (pediatric): Secondary | ICD-10-CM | POA: Diagnosis not present

## 2017-10-22 DIAGNOSIS — G4733 Obstructive sleep apnea (adult) (pediatric): Secondary | ICD-10-CM | POA: Diagnosis not present

## 2017-11-14 DIAGNOSIS — M25512 Pain in left shoulder: Secondary | ICD-10-CM | POA: Diagnosis not present

## 2017-11-17 DIAGNOSIS — M25511 Pain in right shoulder: Secondary | ICD-10-CM | POA: Diagnosis not present

## 2017-11-17 DIAGNOSIS — M7580 Other shoulder lesions, unspecified shoulder: Secondary | ICD-10-CM | POA: Diagnosis not present

## 2017-11-29 DIAGNOSIS — M25521 Pain in right elbow: Secondary | ICD-10-CM | POA: Diagnosis not present

## 2017-11-29 DIAGNOSIS — M7580 Other shoulder lesions, unspecified shoulder: Secondary | ICD-10-CM | POA: Diagnosis not present

## 2017-11-29 DIAGNOSIS — M25511 Pain in right shoulder: Secondary | ICD-10-CM | POA: Diagnosis not present

## 2017-11-29 DIAGNOSIS — M7710 Lateral epicondylitis, unspecified elbow: Secondary | ICD-10-CM | POA: Diagnosis not present

## 2018-01-18 DIAGNOSIS — I10 Essential (primary) hypertension: Secondary | ICD-10-CM | POA: Diagnosis not present

## 2018-01-18 DIAGNOSIS — Z125 Encounter for screening for malignant neoplasm of prostate: Secondary | ICD-10-CM | POA: Diagnosis not present

## 2018-01-18 DIAGNOSIS — Z13220 Encounter for screening for lipoid disorders: Secondary | ICD-10-CM | POA: Diagnosis not present

## 2018-01-18 DIAGNOSIS — Z0000 Encounter for general adult medical examination without abnormal findings: Secondary | ICD-10-CM | POA: Diagnosis not present

## 2018-01-22 DIAGNOSIS — G4733 Obstructive sleep apnea (adult) (pediatric): Secondary | ICD-10-CM | POA: Diagnosis not present

## 2018-01-24 DIAGNOSIS — N529 Male erectile dysfunction, unspecified: Secondary | ICD-10-CM | POA: Diagnosis not present

## 2018-01-24 DIAGNOSIS — K219 Gastro-esophageal reflux disease without esophagitis: Secondary | ICD-10-CM | POA: Diagnosis not present

## 2018-01-24 DIAGNOSIS — Z8546 Personal history of malignant neoplasm of prostate: Secondary | ICD-10-CM | POA: Diagnosis not present

## 2018-01-24 DIAGNOSIS — R32 Unspecified urinary incontinence: Secondary | ICD-10-CM | POA: Diagnosis not present

## 2018-01-30 DIAGNOSIS — K045 Chronic apical periodontitis: Secondary | ICD-10-CM | POA: Diagnosis not present

## 2018-01-30 DIAGNOSIS — M272 Inflammatory conditions of jaws: Secondary | ICD-10-CM | POA: Diagnosis not present

## 2018-02-03 DIAGNOSIS — R0789 Other chest pain: Secondary | ICD-10-CM | POA: Diagnosis not present

## 2018-02-21 DIAGNOSIS — H6123 Impacted cerumen, bilateral: Secondary | ICD-10-CM | POA: Diagnosis not present

## 2018-04-03 DIAGNOSIS — H6120 Impacted cerumen, unspecified ear: Secondary | ICD-10-CM | POA: Diagnosis not present

## 2018-05-11 DIAGNOSIS — H6983 Other specified disorders of Eustachian tube, bilateral: Secondary | ICD-10-CM | POA: Diagnosis not present

## 2018-09-12 DIAGNOSIS — H5213 Myopia, bilateral: Secondary | ICD-10-CM | POA: Diagnosis not present

## 2018-09-29 DIAGNOSIS — Z8546 Personal history of malignant neoplasm of prostate: Secondary | ICD-10-CM | POA: Diagnosis not present

## 2018-09-29 DIAGNOSIS — H6123 Impacted cerumen, bilateral: Secondary | ICD-10-CM | POA: Diagnosis not present

## 2018-10-11 DIAGNOSIS — Z8546 Personal history of malignant neoplasm of prostate: Secondary | ICD-10-CM | POA: Diagnosis not present

## 2018-10-11 DIAGNOSIS — N393 Stress incontinence (female) (male): Secondary | ICD-10-CM | POA: Diagnosis not present

## 2018-12-12 DIAGNOSIS — H9190 Unspecified hearing loss, unspecified ear: Secondary | ICD-10-CM | POA: Diagnosis not present

## 2018-12-26 DIAGNOSIS — Z961 Presence of intraocular lens: Secondary | ICD-10-CM | POA: Diagnosis not present

## 2018-12-26 DIAGNOSIS — H04123 Dry eye syndrome of bilateral lacrimal glands: Secondary | ICD-10-CM | POA: Diagnosis not present

## 2019-01-09 DIAGNOSIS — H04123 Dry eye syndrome of bilateral lacrimal glands: Secondary | ICD-10-CM | POA: Diagnosis not present

## 2019-01-09 DIAGNOSIS — Z961 Presence of intraocular lens: Secondary | ICD-10-CM | POA: Diagnosis not present

## 2019-01-25 DIAGNOSIS — I10 Essential (primary) hypertension: Secondary | ICD-10-CM | POA: Diagnosis not present

## 2019-01-25 DIAGNOSIS — Z125 Encounter for screening for malignant neoplasm of prostate: Secondary | ICD-10-CM | POA: Diagnosis not present

## 2019-01-29 DIAGNOSIS — K219 Gastro-esophageal reflux disease without esophagitis: Secondary | ICD-10-CM | POA: Diagnosis not present

## 2019-01-29 DIAGNOSIS — N529 Male erectile dysfunction, unspecified: Secondary | ICD-10-CM | POA: Diagnosis not present

## 2019-01-29 DIAGNOSIS — R32 Unspecified urinary incontinence: Secondary | ICD-10-CM | POA: Diagnosis not present

## 2019-01-29 DIAGNOSIS — Z0001 Encounter for general adult medical examination with abnormal findings: Secondary | ICD-10-CM | POA: Diagnosis not present

## 2019-02-06 DIAGNOSIS — G4733 Obstructive sleep apnea (adult) (pediatric): Secondary | ICD-10-CM | POA: Diagnosis not present

## 2019-07-31 DIAGNOSIS — N183 Chronic kidney disease, stage 3 (moderate): Secondary | ICD-10-CM | POA: Diagnosis not present

## 2019-08-02 DIAGNOSIS — K219 Gastro-esophageal reflux disease without esophagitis: Secondary | ICD-10-CM | POA: Diagnosis not present

## 2019-08-02 DIAGNOSIS — Z6835 Body mass index (BMI) 35.0-35.9, adult: Secondary | ICD-10-CM | POA: Diagnosis not present

## 2019-08-09 DIAGNOSIS — Z23 Encounter for immunization: Secondary | ICD-10-CM | POA: Diagnosis not present

## 2019-08-10 DIAGNOSIS — G4733 Obstructive sleep apnea (adult) (pediatric): Secondary | ICD-10-CM | POA: Diagnosis not present

## 2019-10-10 DIAGNOSIS — Z8546 Personal history of malignant neoplasm of prostate: Secondary | ICD-10-CM | POA: Diagnosis not present

## 2019-10-17 DIAGNOSIS — Z8546 Personal history of malignant neoplasm of prostate: Secondary | ICD-10-CM | POA: Diagnosis not present

## 2019-10-17 DIAGNOSIS — N393 Stress incontinence (female) (male): Secondary | ICD-10-CM | POA: Diagnosis not present

## 2020-01-28 DIAGNOSIS — N183 Chronic kidney disease, stage 3 unspecified: Secondary | ICD-10-CM | POA: Diagnosis not present

## 2020-01-28 DIAGNOSIS — I10 Essential (primary) hypertension: Secondary | ICD-10-CM | POA: Diagnosis not present

## 2020-01-28 DIAGNOSIS — Z125 Encounter for screening for malignant neoplasm of prostate: Secondary | ICD-10-CM | POA: Diagnosis not present

## 2020-01-31 DIAGNOSIS — K219 Gastro-esophageal reflux disease without esophagitis: Secondary | ICD-10-CM | POA: Diagnosis not present

## 2020-01-31 DIAGNOSIS — S81012A Laceration without foreign body, left knee, initial encounter: Secondary | ICD-10-CM | POA: Diagnosis not present

## 2020-01-31 DIAGNOSIS — N529 Male erectile dysfunction, unspecified: Secondary | ICD-10-CM | POA: Diagnosis not present

## 2020-01-31 DIAGNOSIS — R32 Unspecified urinary incontinence: Secondary | ICD-10-CM | POA: Diagnosis not present

## 2020-02-11 DIAGNOSIS — Z1211 Encounter for screening for malignant neoplasm of colon: Secondary | ICD-10-CM | POA: Diagnosis not present

## 2020-02-11 DIAGNOSIS — Z1212 Encounter for screening for malignant neoplasm of rectum: Secondary | ICD-10-CM | POA: Diagnosis not present

## 2020-02-14 LAB — EXTERNAL GENERIC LAB PROCEDURE: COLOGUARD: NEGATIVE

## 2020-03-12 DIAGNOSIS — G4733 Obstructive sleep apnea (adult) (pediatric): Secondary | ICD-10-CM | POA: Diagnosis not present

## 2020-03-20 DIAGNOSIS — G4733 Obstructive sleep apnea (adult) (pediatric): Secondary | ICD-10-CM | POA: Diagnosis not present

## 2020-07-02 DIAGNOSIS — Z0120 Encounter for dental examination and cleaning without abnormal findings: Secondary | ICD-10-CM | POA: Diagnosis not present

## 2020-09-26 DIAGNOSIS — G4733 Obstructive sleep apnea (adult) (pediatric): Secondary | ICD-10-CM | POA: Diagnosis not present

## 2020-10-02 DIAGNOSIS — Z20822 Contact with and (suspected) exposure to covid-19: Secondary | ICD-10-CM | POA: Diagnosis not present

## 2020-10-06 DIAGNOSIS — H5213 Myopia, bilateral: Secondary | ICD-10-CM | POA: Diagnosis not present

## 2020-10-22 DIAGNOSIS — Z8546 Personal history of malignant neoplasm of prostate: Secondary | ICD-10-CM | POA: Diagnosis not present

## 2020-10-29 DIAGNOSIS — N393 Stress incontinence (female) (male): Secondary | ICD-10-CM | POA: Diagnosis not present

## 2020-10-29 DIAGNOSIS — Z8546 Personal history of malignant neoplasm of prostate: Secondary | ICD-10-CM | POA: Diagnosis not present

## 2020-12-24 DIAGNOSIS — Z23 Encounter for immunization: Secondary | ICD-10-CM | POA: Diagnosis not present

## 2020-12-24 DIAGNOSIS — N390 Urinary tract infection, site not specified: Secondary | ICD-10-CM | POA: Diagnosis not present

## 2021-01-29 DIAGNOSIS — Z125 Encounter for screening for malignant neoplasm of prostate: Secondary | ICD-10-CM | POA: Diagnosis not present

## 2021-01-29 DIAGNOSIS — I10 Essential (primary) hypertension: Secondary | ICD-10-CM | POA: Diagnosis not present

## 2021-02-05 DIAGNOSIS — K219 Gastro-esophageal reflux disease without esophagitis: Secondary | ICD-10-CM | POA: Diagnosis not present

## 2021-02-05 DIAGNOSIS — Z0001 Encounter for general adult medical examination with abnormal findings: Secondary | ICD-10-CM | POA: Diagnosis not present

## 2021-02-05 DIAGNOSIS — N529 Male erectile dysfunction, unspecified: Secondary | ICD-10-CM | POA: Diagnosis not present

## 2021-02-05 DIAGNOSIS — G4733 Obstructive sleep apnea (adult) (pediatric): Secondary | ICD-10-CM | POA: Diagnosis not present

## 2021-02-06 ENCOUNTER — Other Ambulatory Visit: Payer: Self-pay | Admitting: Internal Medicine

## 2021-02-06 DIAGNOSIS — I10 Essential (primary) hypertension: Secondary | ICD-10-CM

## 2021-02-25 ENCOUNTER — Other Ambulatory Visit: Payer: Self-pay

## 2021-03-04 ENCOUNTER — Other Ambulatory Visit: Payer: Self-pay

## 2021-03-30 DIAGNOSIS — G4733 Obstructive sleep apnea (adult) (pediatric): Secondary | ICD-10-CM | POA: Diagnosis not present

## 2021-04-14 ENCOUNTER — Ambulatory Visit
Admission: RE | Admit: 2021-04-14 | Discharge: 2021-04-14 | Disposition: A | Discharge status: Home / Self Care | Payer: No Typology Code available for payment source | Source: Ambulatory Visit | Attending: Internal Medicine | Admitting: Internal Medicine

## 2021-04-14 DIAGNOSIS — I10 Essential (primary) hypertension: Secondary | ICD-10-CM

## 2021-04-16 DIAGNOSIS — I2584 Coronary atherosclerosis due to calcified coronary lesion: Secondary | ICD-10-CM | POA: Diagnosis not present

## 2021-04-16 DIAGNOSIS — I2510 Atherosclerotic heart disease of native coronary artery without angina pectoris: Secondary | ICD-10-CM | POA: Diagnosis not present

## 2021-04-16 DIAGNOSIS — I712 Thoracic aortic aneurysm, without rupture: Secondary | ICD-10-CM | POA: Diagnosis not present

## 2021-05-22 ENCOUNTER — Ambulatory Visit: Payer: Medicare Other | Admitting: Cardiology

## 2021-05-22 ENCOUNTER — Other Ambulatory Visit: Payer: Self-pay

## 2021-05-22 ENCOUNTER — Encounter: Payer: Self-pay | Admitting: Cardiology

## 2021-05-22 VITALS — BP 151/90 | HR 72 | Temp 98.2°F | Resp 16 | Ht 69.0 in | Wt 241.0 lb

## 2021-05-22 DIAGNOSIS — I10 Essential (primary) hypertension: Secondary | ICD-10-CM

## 2021-05-22 DIAGNOSIS — I77810 Thoracic aortic ectasia: Secondary | ICD-10-CM

## 2021-05-22 DIAGNOSIS — R931 Abnormal findings on diagnostic imaging of heart and coronary circulation: Secondary | ICD-10-CM

## 2021-05-22 DIAGNOSIS — Z95828 Presence of other vascular implants and grafts: Secondary | ICD-10-CM | POA: Diagnosis not present

## 2021-05-22 MED ORDER — LOSARTAN POTASSIUM-HCTZ 100-25 MG PO TABS: 1.0000 | ORAL_TABLET | ORAL | 1 refills | Status: DC

## 2021-05-22 NOTE — Progress Notes (Signed)
Primary Physician/Referring:  Merri Brunette, MD  Patient ID: [name], male    DOB: 1946-05-20, 75 y.o.   MRN: [id number]  Chief Complaint  Patient presents with   Coronary Artery Disease   New Patient (Initial Visit)    Referred by Merri Brunette, MD   HPI:    [name]  is a 75 y.o. Caucasian male patient referred to me for evaluation of abnormal coronary calcium score and abnormal imaging showing ascending aortic dilatation.  Past medical history significant for hypertension, history of peptic ulcer disease, colonic diverticulosis, history of colonic perforation SP repair in 2012, developed DVT and then had an IVC filter placed by implantation of a retrievable Celect IVC filter on 06/02/2011.  No evidence of abdominal attic aneurysm at that time.  He is sedentary but denies any chest pain, dyspnea, symptoms of claudication.  Past Medical History:  Diagnosis Date   Diverticulitis of colon with perforation    sigmoid   DVT (deep venous thrombosis) (HCC)    Right leg   Hyperlipidemia    Hypertension    Leukocytosis    Sleep apnea    Ulcer    duodenal   Past Surgical History:  Procedure Laterality Date   COLON SURGERY     Perforated sigmoid diverticulitis/ exp lap   PROSTATE SURGERY     Family History  Problem Relation Age of Onset   Cancer - Lung Mother    Heart attack Father    Heart attack Sister    Heart attack Brother     Social History   Tobacco Use   Smoking status: Former    Packs/day: 1.00    Years: 20.00    Pack years: 20.00    Types: Cigarettes    Quit date: 1974    Years since quitting: 48.5   Smokeless tobacco: Never  Substance Use Topics   Alcohol use: No   Marital Status: Married  ROS  Review of Systems  Cardiovascular:  Negative for chest pain, dyspnea on exertion and leg swelling.  Gastrointestinal:  Negative for melena.  Objective  Blood pressure (!) 151/90, pulse 72, temperature 98.2 F (36.8 C), temperature source  Temporal, resp. rate 16, height 5\' 9"  (1.753 m), weight 241 lb (109.3 kg), SpO2 97 %. Body mass index is 35.59 kg/m.  Vitals with BMI 05/22/2021 04/09/2012 06/28/2011  Height 5\' 9"  5' 9.25" -  Weight 241 lbs 219 lbs 13 oz 212 lbs  BMI 35.57 32.3 -  Systolic 151 152 -  Diastolic 90 82 -  Pulse 72 93 -    Physical Exam Constitutional:      Appearance: He is obese.  Neck:     Vascular: No carotid bruit or JVD.  Cardiovascular:     Rate and Rhythm: Normal rate and regular rhythm.     Pulses: Intact distal pulses.     Heart sounds: Normal heart sounds. No murmur heard.   No gallop.  Pulmonary:     Effort: Pulmonary effort is normal.     Breath sounds: Normal breath sounds.  Abdominal:     General: Bowel sounds are normal.     Palpations: Abdomen is soft.  Musculoskeletal:        General: Swelling (trace bilateral leg edema) present.     Laboratory examination:   External labs:   Labs 01/29/2021:  Hb 15.8/HCT 47.4, platelets 227, normal indicis.  Serum glucose 220 mg, BUN 13, creatinine 1.43, EGFR 52 mL, potassium 4.0, CMP otherwise  normal.  Total cholesterol 127, triglycerides 122, HDL 36, LDL 69.  PSA normal. Medications and allergies  No Known Allergies   Medication prior to this encounter:   Outpatient Medications Prior to Visit  Medication Sig Dispense Refill   Multiple Vitamins-Minerals (MULTIVITAMIN WITH MINERALS) tablet Take 1 tablet by mouth daily.       rosuvastatin (CRESTOR) 5 MG tablet Take 5 mg by mouth daily.     losartan-hydrochlorothiazide (HYZAAR) 50-12.5 MG tablet Take 1 tablet by mouth daily.     Alum & Mag Hydroxide-Simeth (MAGIC MOUTHWASH W/LIDOCAINE) SOLN Take 5 mLs by mouth 4 (four) times daily as needed. 120 mL 0   bismuth subsalicylate (PEPTO BISMOL) 262 MG/15ML suspension Take 15 mLs by mouth as needed.       ibuprofen (ADVIL,MOTRIN) 200 MG tablet Take 200 mg by mouth as needed.       oxyCODONE-acetaminophen (PERCOCET) 5-325 MG per tablet Take 1  tablet by mouth every 4 (four) hours as needed.       pantoprazole (PROTONIX) 20 MG tablet Take 20 mg by mouth daily.     No facility-administered medications prior to visit.    FINAL MEDICATION AS OF TODAY:   Medications after current encounter Current Outpatient Medications  Medication Instructions   losartan-hydrochlorothiazide (HYZAAR) 100-25 MG tablet 1 tablet, Oral, BH-each morning   Multiple Vitamins-Minerals (MULTIVITAMIN WITH MINERALS) tablet 1 tablet, Daily   rosuvastatin (CRESTOR) 5 mg, Oral, Daily   Radiology:   No results found.  Cardiac Studies:   Coronary calcium score 04/14/2021:  Left main: 0 LAD: 13.2 LCx: 0 RCA: 2.98. Total Agatston score 16.2, MeSA database percentile 17. Aortic root measurements 41 mm ascending aorta, descending aorta 36 mm. Elongated nodular density in the right middle lobe measures 3 mm.  EKG:   EKG 05/22/2021: Sinus scratch that normal sinus rhythm at rate of 61 bpm, normal axis, right bundle branch block.  Low-voltage complexes.    Assessment     ICD-10-CM   1. Agatston CAC score, <100  R93.1 EKG 12-Lead    2. Primary hypertension  I10 PCV ECHOCARDIOGRAM COMPLETE    losartan-hydrochlorothiazide (HYZAAR) 100-25 MG tablet    Basic metabolic panel    3. Aortic root dilatation (HCC)  I77.810 PCV ECHOCARDIOGRAM COMPLETE    losartan-hydrochlorothiazide (HYZAAR) 100-25 MG tablet    4. Presence of IVC filter: retrievable Celect IVC filter on 06/02/2011.  L27.517      Medications Discontinued During This Encounter  Medication Reason   bismuth subsalicylate (PEPTO BISMOL) 262 MG/15ML suspension Error   Alum & Mag Hydroxide-Simeth (MAGIC MOUTHWASH W/LIDOCAINE) SOLN Error   oxyCODONE-acetaminophen (PERCOCET) 5-325 MG per tablet Error   ibuprofen (ADVIL,MOTRIN) 200 MG tablet Error   pantoprazole (PROTONIX) 20 MG tablet Error   losartan-hydrochlorothiazide (HYZAAR) 50-12.5 MG tablet Dose change    Meds ordered this encounter   Medications   losartan-hydrochlorothiazide (HYZAAR) 100-25 MG tablet    Sig: Take 1 tablet by mouth every morning.    Dispense:  90 tablet    Refill:  1   Orders Placed This Encounter  Procedures   Basic metabolic panel    Standing Status:   Future    Standing Expiration Date:   05/22/2022   EKG 12-Lead   PCV ECHOCARDIOGRAM COMPLETE    Standing Status:   Future    Standing Expiration Date:   05/22/2022   Recommendations:   CHAITANYA AMEDEE is a 75 y.o. Caucasian male patient referred to me for evaluation of abnormal  coronary calcium score and abnormal imaging showing ascending aortic dilatation.  Past medical history significant for hypertension, history of bleeding peptic ulcer disease, colonic diverticulosis, history of colonic perforation SP repair in 2012, developed DVT and then had an IVC filter placed by implantation of a retrievable Celect IVC filter on 06/02/2011.  No evidence of abdominal attic aneurysm at that time.  I reviewed his external labs and also coronary calcium score.  His percentile is only 17 percentile, he was started on low-dose Crestor which is appropriate in view of normal lipid profile.  Contraindication to aspirin exist in view of life-threatening GI bleed in the past and abdominal perforation of the colonic diverticulosis.  Aortic root dilatation needs to have follow-up, suspect it is related to hypertensive heart disease.  Will increase losartan HCT from 50/12.$RemoveBefore'5mg'[id number]$  to 100/25 mg in the morning, he has labs pending with his PCP, will add BMP to this to be done in 1 week to 2 weeks.  Weight loss again discussed with the patient.  He appears to be motivated.  As he is asymptomatic without chest pain or dyspnea, does not need any stress testing.  With regard to IVC filter, it is a retrievable filter.  It has been there for 10 years.  However long-term potential for perforation, development of IVC syndrome is also potential, hence would like to look and see if I can  potentially retrieve this.  I would like to see him back in 6 weeks for follow-up.    Adrian Prows, MD, [facility] 05/22/2021, 10:04 AM Office: [phone]

## 2021-05-28 DIAGNOSIS — I10 Essential (primary) hypertension: Secondary | ICD-10-CM | POA: Diagnosis not present

## 2021-06-03 ENCOUNTER — Other Ambulatory Visit: Payer: Self-pay | Admitting: Cardiology

## 2021-06-03 DIAGNOSIS — I10 Essential (primary) hypertension: Secondary | ICD-10-CM | POA: Diagnosis not present

## 2021-06-04 LAB — BASIC METABOLIC PANEL
BUN/Creatinine Ratio: 11 (ref 10–24)
BUN: 17 mg/dL (ref 8–27)
CO2: 22 mmol/L (ref 20–29)
Calcium: 9.6 mg/dL (ref 8.6–10.2)
Chloride: 102 mmol/L (ref 96–106)
Creatinine, Ser: 1.52 mg/dL — ABNORMAL HIGH (ref 0.76–1.27)
Glucose: 108 mg/dL — ABNORMAL HIGH (ref 65–99)
Potassium: 4.1 mmol/L (ref 3.5–5.2)
Sodium: 138 mmol/L (ref 134–144)
eGFR: 48 mL/min/{1.73_m2} — ABNORMAL LOW (ref >59)

## 2021-06-08 ENCOUNTER — Ambulatory Visit: Payer: Medicare Other

## 2021-06-08 ENCOUNTER — Other Ambulatory Visit: Payer: Self-pay

## 2021-06-08 DIAGNOSIS — I77810 Thoracic aortic ectasia: Secondary | ICD-10-CM

## 2021-06-08 DIAGNOSIS — I10 Essential (primary) hypertension: Secondary | ICD-10-CM | POA: Diagnosis not present

## 2021-06-08 DIAGNOSIS — R7303 Prediabetes: Secondary | ICD-10-CM | POA: Diagnosis not present

## 2021-06-10 DIAGNOSIS — I10 Essential (primary) hypertension: Secondary | ICD-10-CM | POA: Diagnosis not present

## 2021-06-10 DIAGNOSIS — I2584 Coronary atherosclerosis due to calcified coronary lesion: Secondary | ICD-10-CM | POA: Diagnosis not present

## 2021-06-10 NOTE — Progress Notes (Signed)
Labs 01/29/2021: Serum glucose 220 mg, BUN 13, creatinine 1.43, EGFR 52 mL, potassium 4.0, CMP otherwise normal.  Labs are stable compared to previus. Continue losartan HCT

## 2021-06-16 IMAGING — CT CT CARDIAC CORONARY ARTERY CALCIUM SCORE
3 series · 14 of 20 positions shown, 16 images · non-contrast
Comparison: None.
COMPARISON: None.

Addendum:
CLINICAL DATA: 74-year-old white male with hypertension.

EXAM:
CT CARDIAC CORONARY ARTERY CALCIUM SCORE
TECHNIQUE: Non-contrast imaging through the heart was performed using
prospective ECG gating. Image post processing was performed on an
independent workstation, allowing for quantitative analysis of the
heart and coronary arteries. Note that this exam targets the heart
and the chest was not imaged in its entirety.

[Series 2: calcium scoring 2.00 qr36 bestdiast 69% hrt calciu · axial · 0.48mm/px · z∈[+1694,+1778]mm · 4 of 70 slices shown]
[im 14/70  vessel]
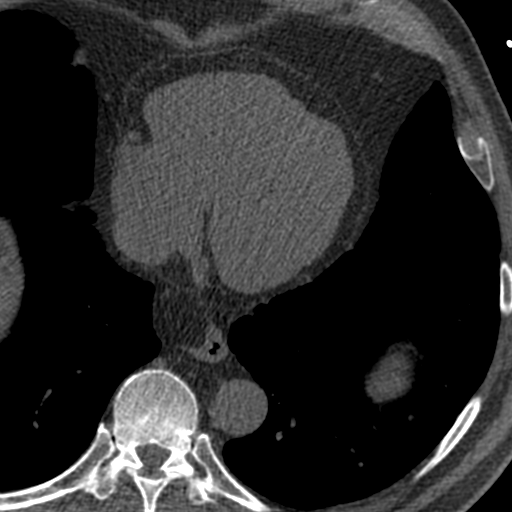
[im 28/70  vessel]
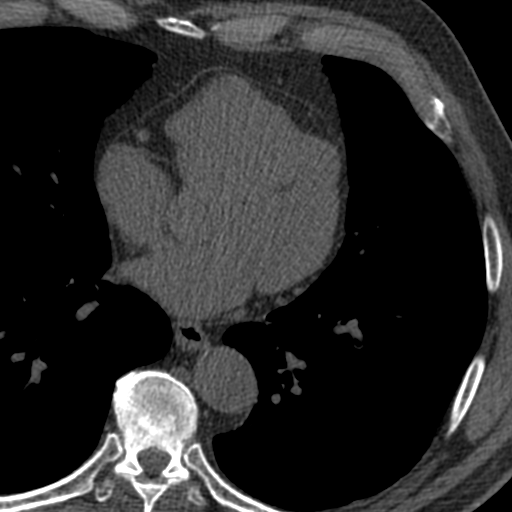
[im 42/70  vessel]
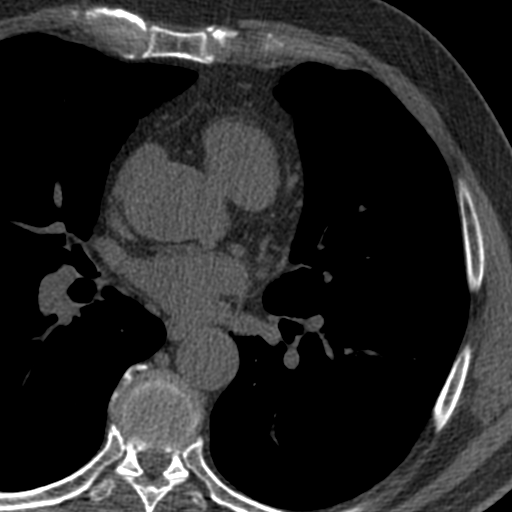
[im 56/70  vessel]
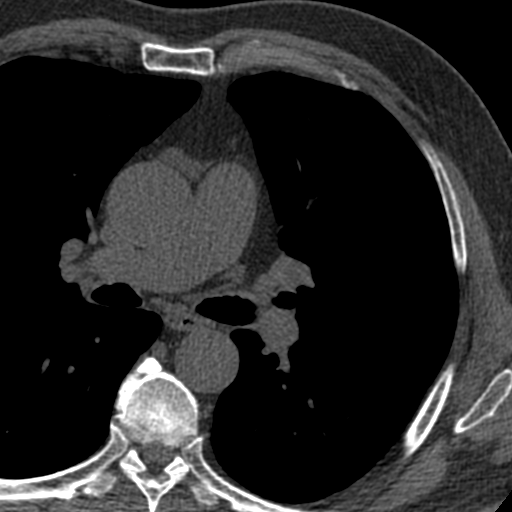

[Series 3: calcium scoring 2.00 br40 bestdiast 69% axial · axial · 0.64mm/px · z∈[+1690,+1782]mm · 5 of 70 slices shown, 7 images]
[im 12/70  vessel]
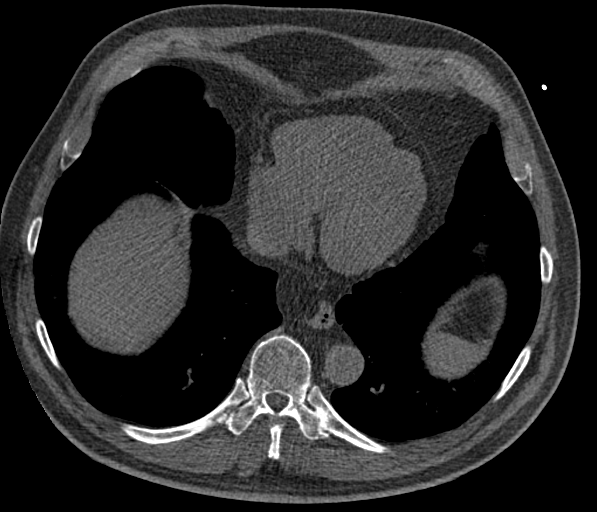
[im 12/70  lung]
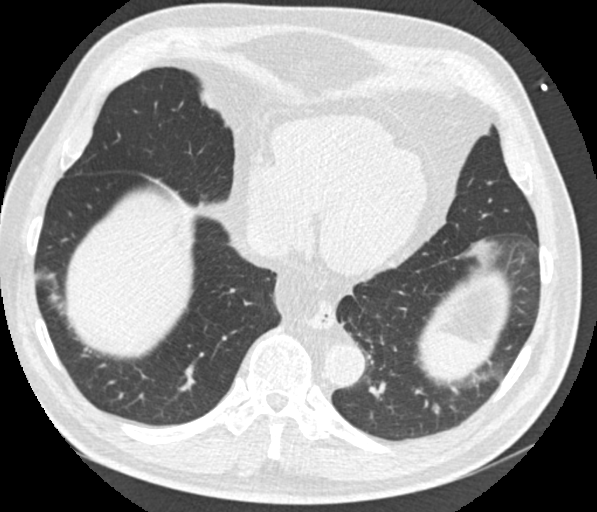
[im 24/70  vessel]
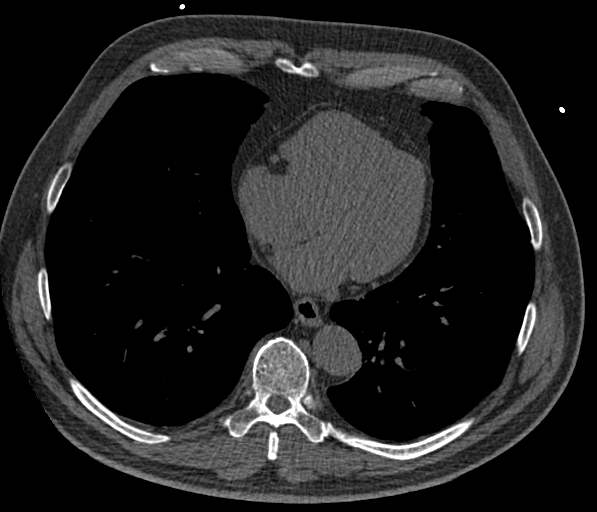
[im 35/70  vessel]
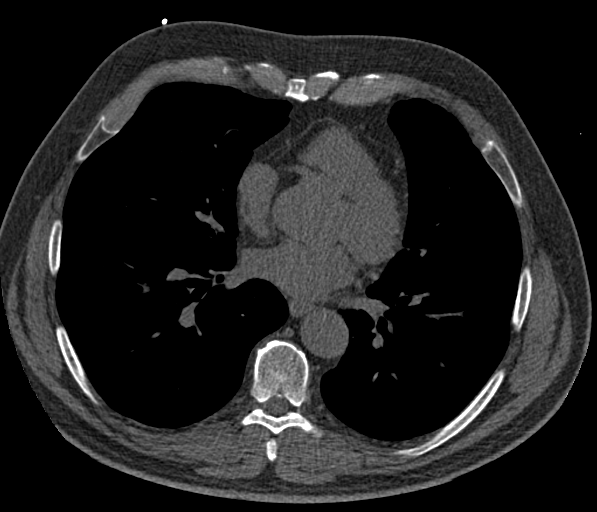
[im 47/70  vessel]
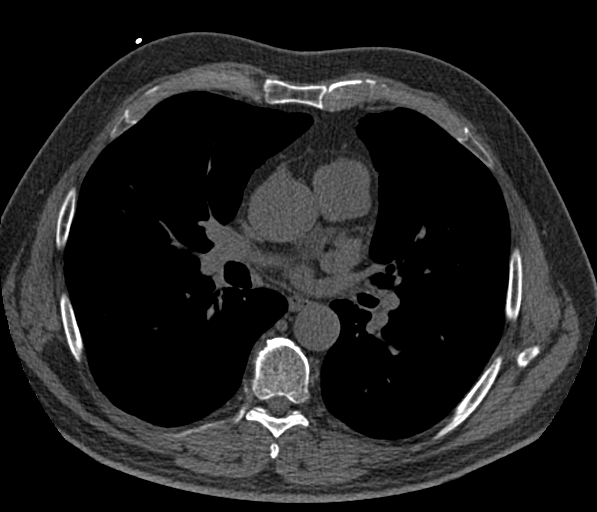
[im 58/70  vessel]
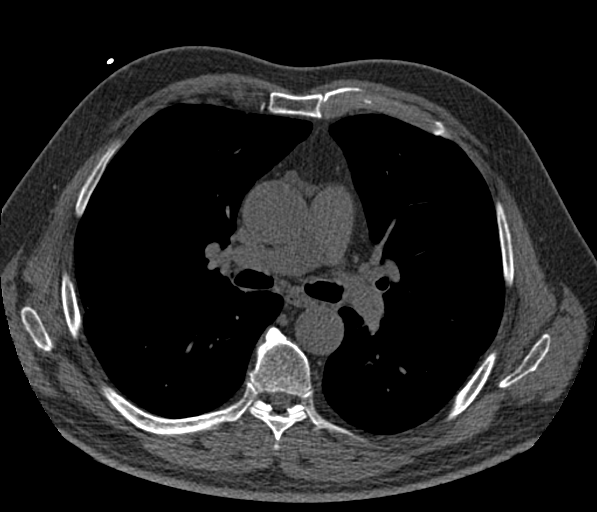
[im 58/70  lung]
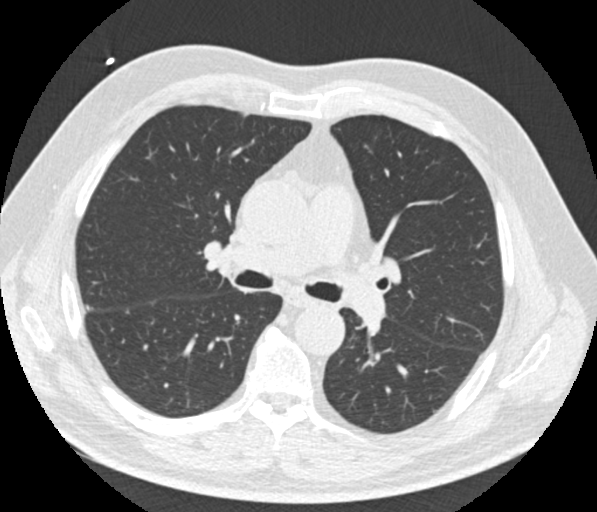

[Series 9: calcium scoring 2.00 br60 bestdiast 69% lungs · axial · 0.65mm/px · z∈[+1690,+1782]mm · 5 of 70 slices shown]
[im 12/70  vessel]
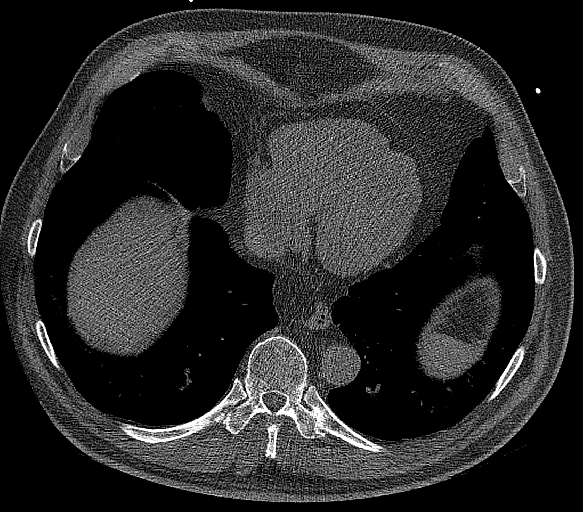
[im 24/70  vessel]
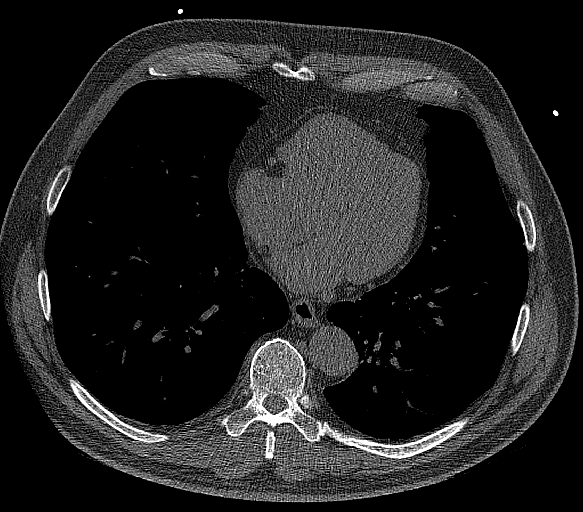
[im 35/70  vessel]
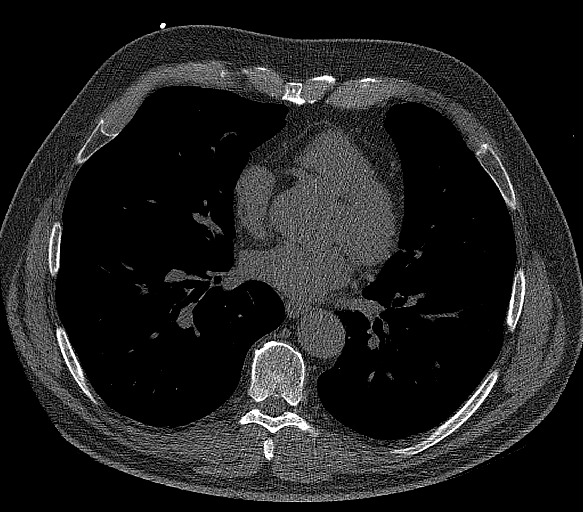
[im 47/70  vessel]
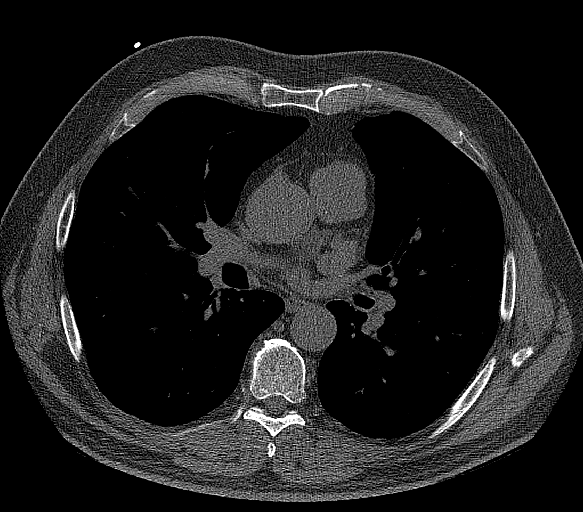
[im 58/70  vessel]
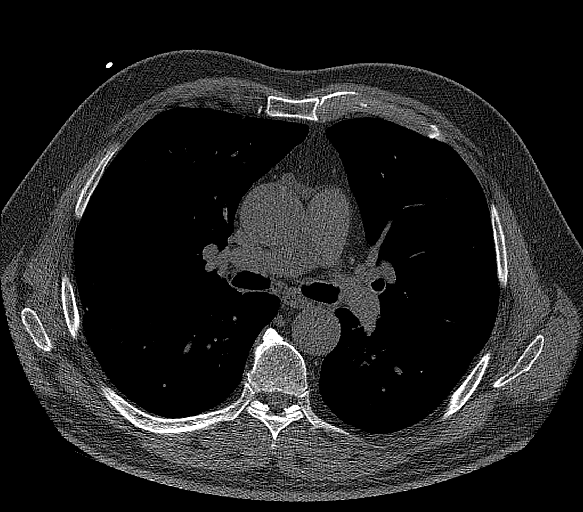

[14 of 20 positions shown; findings below may reference images not displayed]

FINDINGS: CORONARY CALCIUM SCORES:

Left Main: 0

LAD:

LCx: 0

RCA:

Total Agatston Score:

[HOSPITAL] percentile: 17

AORTA MEASUREMENTS:

Ascending Aorta: 41 mm

Descending Aorta: 36 mm

OTHER FINDINGS:

Mediastinal structures are within normal limits. Images of the upper
abdomen are within normal limits. Elongated nodular density in the
right middle lobe measures 3 mm on sequence 9, image 45. Additional
small pleural-based nodular density in the right middle lobe on
image 56. 5 mm nodule in the left lower lobe on sequence 9, image
44. No large pleural effusions. No acute bone abnormality.
IMPRESSION: 1. Coronary calcium score is 16.2 and this is at percentile 17 for
patients of the same age, gender and ethnicity.
2. Small pulmonary nodules, largest measuring 5 mm. These small
nodules are indeterminate. No follow-up needed if patient is
low-risk (and has no known or suspected primary neoplasm).
Non-contrast chest CT can be considered in 12 months if patient is
high-risk. This recommendation follows the consensus statement:
Guidelines for Management of Incidental Pulmonary Nodules Detected

ADDENDUM:
Fusiform aneurysm of the ascending thoracic aorta measuring up to
4.1 cm. Recommend annual imaging followup by CTA or MRA. This
recommendation follows 4949
ACCF/AHA/AATS/ACR/ASA/SCA/SAHEKI/YUXIN/PURI/RTOYOTA Guidelines for the
Diagnosis and Management of Patients with Thoracic Aortic Disease.
Circulation. 4949; 121: [id number]. Aortic aneurysm NOS ([id number].E)

*** End of Addendum ***
FINDINGS: CORONARY CALCIUM SCORES:

Left Main: 0

LAD:

LCx: 0

RCA:

Total Agatston Score:

[HOSPITAL] percentile: 17

AORTA MEASUREMENTS:

Ascending Aorta: 41 mm

Descending Aorta: 36 mm

OTHER FINDINGS:

Mediastinal structures are within normal limits. Images of the upper
abdomen are within normal limits. Elongated nodular density in the
right middle lobe measures 3 mm on sequence 9, image 45. Additional
small pleural-based nodular density in the right middle lobe on
image 56. 5 mm nodule in the left lower lobe on sequence 9, image
44. No large pleural effusions. No acute bone abnormality.
IMPRESSION: 1. Coronary calcium score is 16.2 and this is at percentile 17 for
patients of the same age, gender and ethnicity.
2. Small pulmonary nodules, largest measuring 5 mm. These small
nodules are indeterminate. No follow-up needed if patient is
low-risk (and has no known or suspected primary neoplasm).
Non-contrast chest CT can be considered in 12 months if patient is
high-risk. This recommendation follows the consensus statement:
Guidelines for Management of Incidental Pulmonary Nodules Detected

## 2021-07-03 ENCOUNTER — Encounter: Payer: Self-pay | Admitting: Cardiology

## 2021-07-03 ENCOUNTER — Ambulatory Visit: Payer: Medicare Other | Admitting: Cardiology

## 2021-07-03 ENCOUNTER — Other Ambulatory Visit: Payer: Self-pay

## 2021-07-03 VITALS — BP 121/71 | HR 67 | Temp 98.0°F | Resp 16 | Ht 69.0 in | Wt 239.6 lb

## 2021-07-03 DIAGNOSIS — Z6835 Body mass index (BMI) 35.0-35.9, adult: Secondary | ICD-10-CM

## 2021-07-03 DIAGNOSIS — E6609 Other obesity due to excess calories: Secondary | ICD-10-CM

## 2021-07-03 DIAGNOSIS — I10 Essential (primary) hypertension: Secondary | ICD-10-CM | POA: Diagnosis not present

## 2021-07-03 DIAGNOSIS — Z95828 Presence of other vascular implants and grafts: Secondary | ICD-10-CM | POA: Diagnosis not present

## 2021-07-03 DIAGNOSIS — I77810 Thoracic aortic ectasia: Secondary | ICD-10-CM | POA: Diagnosis not present

## 2021-07-03 NOTE — Progress Notes (Signed)
Primary Physician/Referring:  Deland Pretty, MD  Patient ID: [name], male    DOB: [birth year], 75 y.o.   MRN: [id number]  Chief Complaint  Patient presents with   Hypertension   Aortic Root Dilation   Follow-up    6 weeks   HPI:    [name]  is a 75 y.o. Caucasian male patient referred to me for evaluation of abnormal coronary calcium score and abnormal imaging showing ascending aortic dilatation.  Had seen him 6 weeks ago.  Past medical history significant for hypertension, history of bleeding peptic ulcer disease due to NSAID use in 2012, colonic diverticulosis, history of colonic perforation SP repair in 2012, developed DVT and then had an IVC filter placed by implantation of a retrievable Celect IVC filter on 06/02/2011.  No evidence of abdominal attic aneurysm at that time.  He had seen me 6 weeks ago when he established care regarding primary prevention of cardiovascular issues and elevated coronary calcium score.  He underwent echocardiogram and presents for follow-up.  His wife present.  He is sedentary but denies any chest pain, dyspnea, symptoms of claudication.  Past Medical History:  Diagnosis Date   Diverticulitis of colon with perforation    sigmoid   DVT (deep venous thrombosis) (HCC)    Right leg   Hyperlipidemia    Hypertension    Leukocytosis    Sleep apnea    Ulcer    duodenal   Past Surgical History:  Procedure Laterality Date   COLON SURGERY     Perforated sigmoid diverticulitis/ exp lap   PROSTATE SURGERY     Family History  Problem Relation Age of Onset   Cancer - Lung Mother    Heart attack Father    Heart attack Sister    Heart attack Brother     Social History   Tobacco Use   Smoking status: Former    Packs/day: 1.00    Years: 20.00    Pack years: 20.00    Types: Cigarettes    Quit date: 1974    Years since quitting: 48.6   Smokeless tobacco: Never  Substance Use Topics   Alcohol use: No   Marital Status: Married   ROS  Review of Systems  Cardiovascular:  Negative for chest pain, dyspnea on exertion and leg swelling.  Gastrointestinal:  Negative for melena.  Objective  Blood pressure 121/71, pulse 67, temperature 98 F (36.7 C), temperature source Temporal, resp. rate 16, height _0  (1.753 m), weight 239 lb 9.6 oz (108.7 kg), SpO2 98 %. Body mass index is 35.38 kg/m.  Vitals with BMI 07/03/2021 05/22/2021 04/09/2012  Height _1  _2  5' 9.25"  Weight 239 lbs 10 oz 241 lbs 219 lbs 13 oz  BMI 35.37 49.70 26.3  Systolic 785 885 027  Diastolic 71 90 82  Pulse 67 72 93    Physical Exam Constitutional:      Appearance: He is obese.  Neck:     Vascular: No carotid bruit or JVD.  Cardiovascular:     Rate and Rhythm: Normal rate and regular rhythm.     Pulses: Intact distal pulses.     Heart sounds: Normal heart sounds. No murmur heard.   No gallop.  Pulmonary:     Effort: Pulmonary effort is normal.     Breath sounds: Normal breath sounds.  Abdominal:     General: Bowel sounds are normal.     Palpations: Abdomen is soft.  Musculoskeletal:  General: Swelling (trace bilateral leg edema) present.     Laboratory examination:   External labs:   Labs 01/29/2021:  Hb 15.8/HCT 47.4, platelets 227, normal indicis.  Serum glucose 220 mg, BUN 13, creatinine 1.43, EGFR 52 mL, potassium 4.0, CMP otherwise normal.  Total cholesterol 127, triglycerides 122, HDL 36, LDL 69.  PSA normal. Medications and allergies  No Known Allergies   Medication prior to this encounter:   Outpatient Medications Prior to Visit  Medication Sig Dispense Refill   losartan-hydrochlorothiazide (HYZAAR) 100-25 MG tablet Take 1 tablet by mouth every morning. 90 tablet 1   Multiple Vitamins-Minerals (MULTIVITAMIN WITH MINERALS) tablet Take 1 tablet by mouth daily.       rosuvastatin (CRESTOR) 5 MG tablet Take 5 mg by mouth daily.     No facility-administered medications prior to visit.    FINAL MEDICATION AS OF  TODAY:   Medications after current encounter Current Outpatient Medications  Medication Instructions   losartan-hydrochlorothiazide (HYZAAR) 100-25 MG tablet 1 tablet, Oral, BH-each morning   Multiple Vitamins-Minerals (MULTIVITAMIN WITH MINERALS) tablet 1 tablet, Daily   rosuvastatin (CRESTOR) 5 mg, Oral, Daily   Radiology:   No results found.  Cardiac Studies:   Coronary calcium score 04/14/2021: Left main: 0 LAD: 13.2 LCx: 0 RCA: 2.98. Total Agatston score 16.2, MeSA database percentile 17. Aortic root measurements 41 mm ascending aorta, descending aorta 36 mm. Elongated nodular density in the right middle lobe measures 3 mm.  PCV ECHOCARDIOGRAM COMPLETE 75/08/2584 Normal LV systolic function with visual EF 60-65%. Left ventricle cavity is normal in size. Normal left ventricular wall thickness. Normal global wall motion. Indeterminate diastolic filling pattern, elevated LAP. Trace tricuspid regurgitation.  No evidence of pulmonary hypertension. The aortic root is mildly dilated sinus of valsalva (4.1cm) and sinotubular junction (3.9cm). No prior study for comparison.  EKG:   EKG 05/22/2021: Sinus scratch that normal sinus rhythm at rate of 61 bpm, normal axis, right bundle branch block.  Low-voltage complexes.    Assessment     ICD-10-CM   1. Aortic root dilatation (HCC)  I77.810     2. Primary hypertension  I10     3. Presence of IVC filter: retrievable Celect IVC filter on 06/02/2011.  Z95.828     4. Class 2 obesity due to excess calories without serious comorbidity with body mass index (BMI) of 35.0 to 35.9 in adult  E66.09    Z68.35      There are no discontinued medications.   No orders of the defined types were placed in this encounter.  No orders of the defined types were placed in this encounter.  Recommendations:   [name] is a 75 y.o. Caucasian male patient referred to me for evaluation of abnormal coronary calcium score and abnormal imaging  showing ascending aortic dilatation.  Had seen him 6 weeks ago.  Past medical history significant for hypertension, history of bleeding peptic ulcer disease due to NSAID use in 2012, colonic diverticulosis, history of colonic perforation SP repair in 2012, developed DVT and then had an IVC filter placed by implantation of a retrievable Celect IVC filter on 06/02/2011.  No evidence of abdominal attic aneurysm at that time.  He is on appropriate medical therapy with a high intensity low-dose Crestor for mild hyperlipidemia and aortic atherosclerosis and mild increase in coronary calcium score.  Continue the same.  Blood pressure is well controlled.  We discussed regarding weight loss which would certainly help with improvement in his leg edema  and overall wellbeing.  Reviewed echocardiogram, aortic root dilatation has remained stable.  It correlates very well with CT scans as well hence annual echo and may be CT scans once every 2 to 3 years may be appropriate to avoid radiation.  I discussed with the patient regarding IVC filter, although it has been there for 10 years, it could potentially be retrieved.  Complications related to retrieval including perforation were discussed but complications leaving the IVC filter in with risk of thrombosis and perforation was also discussed but explained to them that this is very low risk overall and it has been there for 10 years.  They would like to think about whether to leave it in order to have an attempt at retrieval.  I will see him back in a year with repeat echocardiogram.  No changes in the medications were done today.    Adrian Prows, MD, [facility] 07/03/2021, 10:15 PM Office: [phone]

## 2021-09-04 DIAGNOSIS — Z23 Encounter for immunization: Secondary | ICD-10-CM | POA: Diagnosis not present

## 2021-10-21 DIAGNOSIS — Z8546 Personal history of malignant neoplasm of prostate: Secondary | ICD-10-CM | POA: Diagnosis not present

## 2021-10-23 DIAGNOSIS — G4733 Obstructive sleep apnea (adult) (pediatric): Secondary | ICD-10-CM | POA: Diagnosis not present

## 2021-10-28 DIAGNOSIS — N5201 Erectile dysfunction due to arterial insufficiency: Secondary | ICD-10-CM | POA: Diagnosis not present

## 2021-10-28 DIAGNOSIS — N393 Stress incontinence (female) (male): Secondary | ICD-10-CM | POA: Diagnosis not present

## 2021-10-28 DIAGNOSIS — Z8546 Personal history of malignant neoplasm of prostate: Secondary | ICD-10-CM | POA: Diagnosis not present

## 2021-11-19 ENCOUNTER — Other Ambulatory Visit: Payer: Self-pay | Admitting: Cardiology

## 2021-11-19 DIAGNOSIS — I10 Essential (primary) hypertension: Secondary | ICD-10-CM

## 2021-11-19 DIAGNOSIS — I77810 Thoracic aortic ectasia: Secondary | ICD-10-CM

## 2022-02-03 DIAGNOSIS — K219 Gastro-esophageal reflux disease without esophagitis: Secondary | ICD-10-CM | POA: Diagnosis not present

## 2022-02-03 DIAGNOSIS — I10 Essential (primary) hypertension: Secondary | ICD-10-CM | POA: Diagnosis not present

## 2022-02-03 DIAGNOSIS — Z125 Encounter for screening for malignant neoplasm of prostate: Secondary | ICD-10-CM | POA: Diagnosis not present

## 2022-02-08 DIAGNOSIS — I7121 Aneurysm of the ascending aorta, without rupture: Secondary | ICD-10-CM | POA: Diagnosis not present

## 2022-02-08 DIAGNOSIS — N529 Male erectile dysfunction, unspecified: Secondary | ICD-10-CM | POA: Diagnosis not present

## 2022-02-08 DIAGNOSIS — Z0000 Encounter for general adult medical examination without abnormal findings: Secondary | ICD-10-CM | POA: Diagnosis not present

## 2022-02-08 DIAGNOSIS — K219 Gastro-esophageal reflux disease without esophagitis: Secondary | ICD-10-CM | POA: Diagnosis not present

## 2022-02-15 DIAGNOSIS — G4733 Obstructive sleep apnea (adult) (pediatric): Secondary | ICD-10-CM | POA: Diagnosis not present

## 2022-02-17 ENCOUNTER — Telehealth: Payer: Self-pay

## 2022-02-17 NOTE — Telephone Encounter (Signed)
LMOM with Dr Pennie Banter referral cord. To please get a current imaging ( CTA Chest ) for the referral sent over, DX is TAA 4.1 cm noted on Cardiac Ct from 03/2021.  ?

## 2022-04-06 DIAGNOSIS — R7303 Prediabetes: Secondary | ICD-10-CM | POA: Diagnosis not present

## 2022-04-06 DIAGNOSIS — I10 Essential (primary) hypertension: Secondary | ICD-10-CM | POA: Diagnosis not present

## 2022-04-07 DIAGNOSIS — Z6836 Body mass index (BMI) 36.0-36.9, adult: Secondary | ICD-10-CM | POA: Diagnosis not present

## 2022-04-07 DIAGNOSIS — I10 Essential (primary) hypertension: Secondary | ICD-10-CM | POA: Diagnosis not present

## 2022-04-07 DIAGNOSIS — N1832 Chronic kidney disease, stage 3b: Secondary | ICD-10-CM | POA: Diagnosis not present

## 2022-05-05 DIAGNOSIS — I10 Essential (primary) hypertension: Secondary | ICD-10-CM | POA: Diagnosis not present

## 2022-05-05 DIAGNOSIS — R7303 Prediabetes: Secondary | ICD-10-CM | POA: Diagnosis not present

## 2022-05-05 DIAGNOSIS — K219 Gastro-esophageal reflux disease without esophagitis: Secondary | ICD-10-CM | POA: Diagnosis not present

## 2022-05-05 DIAGNOSIS — E7800 Pure hypercholesterolemia, unspecified: Secondary | ICD-10-CM | POA: Diagnosis not present

## 2022-05-28 ENCOUNTER — Other Ambulatory Visit: Payer: Self-pay | Admitting: Cardiology

## 2022-05-28 DIAGNOSIS — I10 Essential (primary) hypertension: Secondary | ICD-10-CM

## 2022-05-28 DIAGNOSIS — I77810 Thoracic aortic ectasia: Secondary | ICD-10-CM

## 2022-06-09 DIAGNOSIS — I7121 Aneurysm of the ascending aorta, without rupture: Secondary | ICD-10-CM | POA: Diagnosis not present

## 2022-07-02 DIAGNOSIS — G4733 Obstructive sleep apnea (adult) (pediatric): Secondary | ICD-10-CM | POA: Diagnosis not present

## 2022-07-05 ENCOUNTER — Ambulatory Visit: Payer: Medicare Other | Admitting: Cardiology

## 2022-07-12 ENCOUNTER — Ambulatory Visit: Payer: Medicare Other | Admitting: Cardiology

## 2022-07-14 DIAGNOSIS — I712 Thoracic aortic aneurysm, without rupture, unspecified: Secondary | ICD-10-CM | POA: Diagnosis not present

## 2022-07-14 DIAGNOSIS — I7121 Aneurysm of the ascending aorta, without rupture: Secondary | ICD-10-CM | POA: Diagnosis not present

## 2022-07-29 ENCOUNTER — Ambulatory Visit: Payer: Medicare Other | Admitting: Cardiology

## 2022-07-29 ENCOUNTER — Encounter: Payer: Self-pay | Admitting: Cardiology

## 2022-07-29 VITALS — BP 124/77 | HR 63 | Temp 98.1°F | Resp 14 | Ht 69.0 in | Wt 243.0 lb

## 2022-07-29 DIAGNOSIS — I77810 Thoracic aortic ectasia: Secondary | ICD-10-CM

## 2022-07-29 DIAGNOSIS — I10 Essential (primary) hypertension: Secondary | ICD-10-CM

## 2022-07-29 DIAGNOSIS — N1831 Chronic kidney disease, stage 3a: Secondary | ICD-10-CM

## 2022-07-29 NOTE — Progress Notes (Signed)
Primary Physician/Referring:  Deland Pretty, MD  Patient ID: [name], male    DOB: [birth year], 76 y.o.   MRN: [id number]  Chief Complaint  Patient presents with   Aortic root dilatation   Hypertension   Follow-up    1 year   HPI:    [name]  is a 76 y.o. Caucasian male with mild ascending aortic dilatation.  Minimal coronary calcium noted on CT scan, placing him in 22nd percentile, very mild hyperlipidemia, hypertension, stage IIIa chronic kidney disease, history of remote IVC filter placement with a retrievable Celect IVC filter on 06/02/2011 during sepsis and DVT after surgery.  No evidence of abdominal attic aneurysm at that time.  He presents for an annual visit.  No specific complaints.  Agrees being sedentary.  Past Medical History:  Diagnosis Date   Diverticulitis of colon with perforation    sigmoid   DVT (deep venous thrombosis) (HCC)    Right leg   Hyperlipidemia    Hypertension    Leukocytosis    Sleep apnea    Ulcer    duodenal   Past Surgical History:  Procedure Laterality Date   COLON SURGERY     Perforated sigmoid diverticulitis/ exp lap   PROSTATE SURGERY     Family History  Problem Relation Age of Onset   Cancer - Lung Mother    Heart attack Father    Heart attack Sister    Heart attack Brother     Social History   Tobacco Use   Smoking status: Former    Packs/day: 1.00    Years: 20.00    Total pack years: 20.00    Types: Cigarettes    Quit date: 1974    Years since quitting: 49.7   Smokeless tobacco: Never  Substance Use Topics   Alcohol use: No   Marital Status: Married  ROS  Review of Systems  Cardiovascular:  Negative for chest pain, dyspnea on exertion and leg swelling.  Gastrointestinal:  Negative for melena.   Objective  Blood pressure 124/77, pulse 63, temperature 98.1 F (36.7 C), temperature source Temporal, resp. rate 14, height _0  (1.753 m), weight 243 lb (110.2 kg), SpO2 93 %. Body mass index is 35.88  kg/m.     07/29/2022   12:00 PM 07/03/2021   10:25 AM 05/22/2021    9:25 AM  Vitals with BMI  Height _1  _2  _3   Weight 243 lbs 239 lbs 10 oz 241 lbs  BMI 35.87 12.45 80.99  Systolic 833 825 053  Diastolic 77 71 90  Pulse 63 67 72    Physical Exam Constitutional:      Appearance: He is obese.  Neck:     Vascular: No carotid bruit or JVD.  Cardiovascular:     Rate and Rhythm: Normal rate and regular rhythm.     Pulses: Intact distal pulses.     Heart sounds: Normal heart sounds. No murmur heard.    No gallop.  Pulmonary:     Effort: Pulmonary effort is normal.     Breath sounds: Normal breath sounds.  Abdominal:     General: Bowel sounds are normal.     Palpations: Abdomen is soft.  Musculoskeletal:        General: Swelling (trace bilateral leg edema) present.      Laboratory examination:   External labs:   Cholesterol, total 100.000 m 06/10/2021 HDL 33.000 mg 06/10/2021 LDL 69.000 mg 01/29/2021 Triglycerides 94.000 mg 02/03/2022  Labs 01/29/2021:  Hb 15.8/HCT 47.4, platelets 227, normal indicis.  Serum glucose 220 mg, BUN 13, creatinine 1.43, EGFR 52 mL, potassium 4.0, CMP otherwise normal.  Total cholesterol 127, triglycerides 122, HDL 36, LDL 69.  PSA normal. Medications and allergies  No Known Allergies    MEDICATION     Current Outpatient Medications:    losartan-hydrochlorothiazide (HYZAAR) 100-25 MG tablet, TAKE 1 TABLET BY MOUTH EVERY DAY IN THE MORNING, Disp: 90 tablet, Rfl: 1   Multiple Vitamins-Minerals (MULTIVITAMIN WITH MINERALS) tablet, Take 1 tablet by mouth daily.  , Disp: , Rfl:    rosuvastatin (CRESTOR) 5 MG tablet, Take 5 mg by mouth daily., Disp: , Rfl:   Radiology:   CT scanning of the chest 07/14/2022 without contrast: Heart/vessels: Normal heart size. No pericardial effusion. Ectasia of the proximal ascending aorta, measuring 4.4 cm .Ectasia of the descending aorta, immediately distal to the left subclavian origin, measuring  approximately 4.3 x 4.4 cm in maximal diameter, perpendicular to longitudinal  the axis of flow.  Lungs/pleura: Scattered bilateral pulmonary nodules, marked on PACS on series 7, none larger than 5 mm. No focal lung consolidation. Bilateral lower lobe subsegmental atelectasis. No pleural effusion or pneumothorax.   Cardiac Studies:   Coronary calcium score 04/14/2021: Left main: 0 LAD: 13.2 LCx: 0 RCA: 2.98. Total Agatston score 16.2, MeSA database percentile 17. Aortic root measurements 41 mm ascending aorta, descending aorta 36 mm. Elongated nodular density in the right middle lobe measures 3 mm.  PCV ECHOCARDIOGRAM COMPLETE 06/08/2021  Normal LV systolic function with visual EF 60-65%. Left ventricle cavity is normal in size. Normal left ventricular wall thickness. Normal global wall motion. Indeterminate diastolic filling pattern, elevated LAP. Trace tricuspid regurgitation.  No evidence of pulmonary hypertension. The aortic root is mildly dilated sinus of valsalva (4.1cm) and sinotubular junction (3.9cm). No prior study for comparison.    EKG:   EKG 07/29/2022: Normal sinus rhythm at rate of 62 bpm, right bundle branch block.  Single PVC.  No change from 05/22/2021.  Assessment     ICD-10-CM   1. Aortic root dilatation (HCC)  I77.810 EKG 12-Lead    2. Primary hypertension  I10      There are no discontinued medications.   No orders of the defined types were placed in this encounter.  Orders Placed This Encounter  Procedures   EKG 12-Lead    Recommendations:   [name] is a 76 y.o. Caucasian male with mild ascending aortic dilatation.  Minimal coronary calcium noted on CT scan, placing him in 22nd percentile, very mild hyperlipidemia, hypertension, stage IIIa chronic kidney disease, history of remote IVC filter placement with a retrievable Celect IVC filter on 06/02/2011 during sepsis and DVT after surgery.  No evidence of abdominal attic aneurysm at that time.  He  presents for an annual visit.  This is annual visit, remains asymptomatic however markedly sedentary.  We have discussed extensively regarding his weight loss, presently being 35.88 BMI.  I reviewed the external report from [facility].  CT scan revealing mild increase in the size of the ascending aortic aneurysm.  I would like to repeat echocardiogram and see if I can again correlate with prior findings of ascending aortic aneurysm to avoid radiation.  Blood pressure is well controlled, lipids are also under excellent control.  I would like to see him back in a year, depending upon the echocardiographic correlation with CT scan, we may either repeat echocardiogram or he may need annual CT  scan.  This was explained clearly to the patient and his wife per patient, renal function has remained stable.   Adrian Prows, MD, [facility] 07/29/2022, 12:25 PM Office: [phone]

## 2022-08-26 ENCOUNTER — Ambulatory Visit: Payer: Medicare Other

## 2022-08-26 DIAGNOSIS — I10 Essential (primary) hypertension: Secondary | ICD-10-CM | POA: Diagnosis not present

## 2022-08-26 DIAGNOSIS — I77810 Thoracic aortic ectasia: Secondary | ICD-10-CM | POA: Diagnosis not present

## 2022-08-27 ENCOUNTER — Other Ambulatory Visit: Payer: Medicare Other

## 2022-08-29 NOTE — Progress Notes (Signed)
Please schedule echo in 1 year and OV after the echo. He has earlier appointment. Stable aortic aneurysm (small)

## 2022-09-23 DIAGNOSIS — Z23 Encounter for immunization: Secondary | ICD-10-CM | POA: Diagnosis not present

## 2022-10-22 DIAGNOSIS — Z8546 Personal history of malignant neoplasm of prostate: Secondary | ICD-10-CM | POA: Diagnosis not present

## 2022-10-29 DIAGNOSIS — N5201 Erectile dysfunction due to arterial insufficiency: Secondary | ICD-10-CM | POA: Diagnosis not present

## 2022-10-29 DIAGNOSIS — N393 Stress incontinence (female) (male): Secondary | ICD-10-CM | POA: Diagnosis not present

## 2022-10-29 DIAGNOSIS — Z8546 Personal history of malignant neoplasm of prostate: Secondary | ICD-10-CM | POA: Diagnosis not present

## 2022-11-25 ENCOUNTER — Other Ambulatory Visit: Payer: Self-pay | Admitting: Cardiology

## 2022-11-25 DIAGNOSIS — I77810 Thoracic aortic ectasia: Secondary | ICD-10-CM

## 2022-11-25 DIAGNOSIS — I10 Essential (primary) hypertension: Secondary | ICD-10-CM

## 2022-12-20 DIAGNOSIS — H938X3 Other specified disorders of ear, bilateral: Secondary | ICD-10-CM | POA: Diagnosis not present

## 2022-12-20 DIAGNOSIS — H6123 Impacted cerumen, bilateral: Secondary | ICD-10-CM | POA: Diagnosis not present

## 2023-01-19 DIAGNOSIS — Z86718 Personal history of other venous thrombosis and embolism: Secondary | ICD-10-CM | POA: Diagnosis not present

## 2023-01-19 DIAGNOSIS — Z79899 Other long term (current) drug therapy: Secondary | ICD-10-CM | POA: Diagnosis not present

## 2023-01-19 DIAGNOSIS — I351 Nonrheumatic aortic (valve) insufficiency: Secondary | ICD-10-CM | POA: Diagnosis not present

## 2023-01-19 DIAGNOSIS — R918 Other nonspecific abnormal finding of lung field: Secondary | ICD-10-CM | POA: Diagnosis not present

## 2023-01-19 DIAGNOSIS — I712 Thoracic aortic aneurysm, without rupture, unspecified: Secondary | ICD-10-CM | POA: Diagnosis not present

## 2023-01-19 DIAGNOSIS — Z8711 Personal history of peptic ulcer disease: Secondary | ICD-10-CM | POA: Diagnosis not present

## 2023-01-19 DIAGNOSIS — I77810 Thoracic aortic ectasia: Secondary | ICD-10-CM | POA: Diagnosis not present

## 2023-01-19 DIAGNOSIS — I10 Essential (primary) hypertension: Secondary | ICD-10-CM | POA: Diagnosis not present

## 2023-02-10 DIAGNOSIS — R7303 Prediabetes: Secondary | ICD-10-CM | POA: Diagnosis not present

## 2023-02-10 DIAGNOSIS — I10 Essential (primary) hypertension: Secondary | ICD-10-CM | POA: Diagnosis not present

## 2023-02-10 DIAGNOSIS — Z125 Encounter for screening for malignant neoplasm of prostate: Secondary | ICD-10-CM | POA: Diagnosis not present

## 2023-02-14 DIAGNOSIS — K219 Gastro-esophageal reflux disease without esophagitis: Secondary | ICD-10-CM | POA: Diagnosis not present

## 2023-02-14 DIAGNOSIS — Z0000 Encounter for general adult medical examination without abnormal findings: Secondary | ICD-10-CM | POA: Diagnosis not present

## 2023-02-14 DIAGNOSIS — R32 Unspecified urinary incontinence: Secondary | ICD-10-CM | POA: Diagnosis not present

## 2023-02-14 DIAGNOSIS — I2510 Atherosclerotic heart disease of native coronary artery without angina pectoris: Secondary | ICD-10-CM | POA: Diagnosis not present

## 2023-02-14 DIAGNOSIS — I10 Essential (primary) hypertension: Secondary | ICD-10-CM | POA: Diagnosis not present

## 2023-03-02 DIAGNOSIS — K080 Exfoliation of teeth due to systemic causes: Secondary | ICD-10-CM | POA: Diagnosis not present

## 2023-03-15 DIAGNOSIS — N2581 Secondary hyperparathyroidism of renal origin: Secondary | ICD-10-CM | POA: Diagnosis not present

## 2023-03-15 DIAGNOSIS — N1832 Chronic kidney disease, stage 3b: Secondary | ICD-10-CM | POA: Diagnosis not present

## 2023-03-15 DIAGNOSIS — I129 Hypertensive chronic kidney disease with stage 1 through stage 4 chronic kidney disease, or unspecified chronic kidney disease: Secondary | ICD-10-CM | POA: Diagnosis not present

## 2023-03-21 DIAGNOSIS — H5203 Hypermetropia, bilateral: Secondary | ICD-10-CM | POA: Diagnosis not present

## 2023-03-30 DIAGNOSIS — K080 Exfoliation of teeth due to systemic causes: Secondary | ICD-10-CM | POA: Diagnosis not present

## 2023-05-30 DIAGNOSIS — N1832 Chronic kidney disease, stage 3b: Secondary | ICD-10-CM | POA: Diagnosis not present

## 2023-06-06 DIAGNOSIS — N2581 Secondary hyperparathyroidism of renal origin: Secondary | ICD-10-CM | POA: Diagnosis not present

## 2023-06-06 DIAGNOSIS — D631 Anemia in chronic kidney disease: Secondary | ICD-10-CM | POA: Diagnosis not present

## 2023-06-06 DIAGNOSIS — I129 Hypertensive chronic kidney disease with stage 1 through stage 4 chronic kidney disease, or unspecified chronic kidney disease: Secondary | ICD-10-CM | POA: Diagnosis not present

## 2023-06-06 DIAGNOSIS — N189 Chronic kidney disease, unspecified: Secondary | ICD-10-CM | POA: Diagnosis not present

## 2023-06-06 DIAGNOSIS — N1832 Chronic kidney disease, stage 3b: Secondary | ICD-10-CM | POA: Diagnosis not present

## 2023-06-07 ENCOUNTER — Other Ambulatory Visit: Payer: Self-pay | Admitting: Internal Medicine

## 2023-06-07 DIAGNOSIS — N1832 Chronic kidney disease, stage 3b: Secondary | ICD-10-CM

## 2023-06-07 DIAGNOSIS — I129 Hypertensive chronic kidney disease with stage 1 through stage 4 chronic kidney disease, or unspecified chronic kidney disease: Secondary | ICD-10-CM

## 2023-06-09 ENCOUNTER — Ambulatory Visit
Admission: RE | Admit: 2023-06-09 | Discharge: 2023-06-09 | Disposition: A | Discharge status: Home / Self Care | Payer: Medicare Other | Source: Ambulatory Visit | Attending: Internal Medicine | Admitting: Internal Medicine

## 2023-06-09 DIAGNOSIS — N1330 Unspecified hydronephrosis: Secondary | ICD-10-CM | POA: Diagnosis not present

## 2023-06-09 DIAGNOSIS — N1832 Chronic kidney disease, stage 3b: Secondary | ICD-10-CM

## 2023-06-09 DIAGNOSIS — I129 Hypertensive chronic kidney disease with stage 1 through stage 4 chronic kidney disease, or unspecified chronic kidney disease: Secondary | ICD-10-CM

## 2023-07-27 DIAGNOSIS — Z86718 Personal history of other venous thrombosis and embolism: Secondary | ICD-10-CM | POA: Diagnosis not present

## 2023-07-27 DIAGNOSIS — I7123 Aneurysm of the descending thoracic aorta, without rupture: Secondary | ICD-10-CM | POA: Diagnosis not present

## 2023-07-27 DIAGNOSIS — Z09 Encounter for follow-up examination after completed treatment for conditions other than malignant neoplasm: Secondary | ICD-10-CM | POA: Diagnosis not present

## 2023-07-27 DIAGNOSIS — I10 Essential (primary) hypertension: Secondary | ICD-10-CM | POA: Diagnosis not present

## 2023-07-27 DIAGNOSIS — I77810 Thoracic aortic ectasia: Secondary | ICD-10-CM | POA: Diagnosis not present

## 2023-07-27 DIAGNOSIS — I7121 Aneurysm of the ascending aorta, without rupture: Secondary | ICD-10-CM | POA: Diagnosis not present

## 2023-07-27 DIAGNOSIS — R918 Other nonspecific abnormal finding of lung field: Secondary | ICD-10-CM | POA: Diagnosis not present

## 2023-07-28 ENCOUNTER — Encounter: Payer: Self-pay | Admitting: Cardiology

## 2023-07-28 ENCOUNTER — Ambulatory Visit: Payer: Medicare Other | Admitting: Cardiology

## 2023-07-28 VITALS — BP 120/78 | HR 53 | Resp 17 | Ht 69.0 in | Wt 246.0 lb

## 2023-07-28 DIAGNOSIS — I129 Hypertensive chronic kidney disease with stage 1 through stage 4 chronic kidney disease, or unspecified chronic kidney disease: Secondary | ICD-10-CM | POA: Diagnosis not present

## 2023-07-28 DIAGNOSIS — N1832 Chronic kidney disease, stage 3b: Secondary | ICD-10-CM | POA: Diagnosis not present

## 2023-07-28 DIAGNOSIS — I7121 Aneurysm of the ascending aorta, without rupture: Secondary | ICD-10-CM

## 2023-07-28 DIAGNOSIS — I7123 Aneurysm of the descending thoracic aorta, without rupture: Secondary | ICD-10-CM

## 2023-07-28 DIAGNOSIS — I10 Essential (primary) hypertension: Secondary | ICD-10-CM

## 2023-07-28 NOTE — Progress Notes (Signed)
Primary Physician/Referring:  Merri Brunette, MD  Patient ID: [name], male    DOB: May 27, 1946, 77 y.o.   MRN: [id number]  Chief Complaint  Patient presents with   Aortic root dilatation    Hypertension   Follow-up    1 year   HPI:    [name]  is a 77 y.o. Caucasian male with thoracic aortic aneurysm including ascending and descending, hypertension, mild hypercholesterolemia, stage IIIa-B chronic kidney disease, history of remote IVC filter placement with a retrievable Celect IVC filter on 06/02/2011 during sepsis and DVT after surgery.  No evidence of abdominal attic aneurysm at that time.  He presents for an annual visit.  No specific complaints.  Agrees being sedentary.  Past Medical History:  Diagnosis Date   Diverticulitis of colon with perforation    sigmoid   DVT (deep venous thrombosis) (HCC)    Right leg   Hyperlipidemia    Hypertension    Leukocytosis    Sleep apnea    Ulcer    duodenal   Past Surgical History:  Procedure Laterality Date   COLON SURGERY     Perforated sigmoid diverticulitis/ exp lap   PROSTATE SURGERY     Family History  Problem Relation Age of Onset   Cancer - Lung Mother    Heart attack Father    Heart attack Sister    Heart attack Brother     Social History   Tobacco Use   Smoking status: Former    Current packs/day: 0.00    Average packs/day: 1 pack/day for 20.0 years (20.0 ttl pk-yrs)    Types: Cigarettes    Start date: 94    Quit date: 19    Years since quitting: 50.7   Smokeless tobacco: Never  Substance Use Topics   Alcohol use: No   Marital Status: Married  ROS  Review of Systems  Cardiovascular:  Negative for chest pain, dyspnea on exertion and leg swelling.  Gastrointestinal:  Negative for melena.   Objective  Blood pressure 120/78, pulse (!) 53, resp. rate 17, height 5\' 9"  (1.753 m), weight 246 lb (111.6 kg), SpO2 98%. Body mass index is 36.33 kg/m.     07/28/2023    8:37 AM 07/29/2022    12:00 PM 07/03/2021   10:25 AM  Vitals with BMI  Height 5\' 9"  5\' 9"  5\' 9"   Weight 246 lbs 243 lbs 239 lbs 10 oz  BMI 36.31 35.87 35.37  Systolic 120 124 027  Diastolic 78 77 71  Pulse 53 63 67    Physical Exam Constitutional:      Appearance: He is obese.  Neck:     Vascular: No carotid bruit or JVD.  Cardiovascular:     Rate and Rhythm: Normal rate and regular rhythm.     Pulses: Intact distal pulses.     Heart sounds: Normal heart sounds. No murmur heard.    No gallop.  Pulmonary:     Effort: Pulmonary effort is normal.     Breath sounds: Normal breath sounds.  Abdominal:     General: Bowel sounds are normal.     Palpations: Abdomen is soft.  Musculoskeletal:        General: Swelling (trace bilateral leg edema) present.      Laboratory examination:   External labs:   Labs 02/14/2023:  A1c 5.8%.  Total cholesterol 94, triglycerides 105, HDL 35, LDL 38.  Hb 14.9/HCT 43.5, platelets 203, normal indicis.  Sodium 143, potassium 3.6,  BUN 15, creatinine 1.63, EGFR 40 mL. Medications and allergies  No Known Allergies    MEDICATION     Current Outpatient Medications:    acetaminophen (TYLENOL) 325 MG tablet, Take 325 mg by mouth every 4 (four) hours as needed., Disp: , Rfl:    losartan-hydrochlorothiazide (HYZAAR) 100-25 MG tablet, TAKE 1 TABLET BY MOUTH EVERY DAY IN THE MORNING, Disp: 90 tablet, Rfl: 2   Multiple Vitamins-Minerals (MULTIVITAMIN WITH MINERALS) tablet, Take 1 tablet by mouth daily.  , Disp: , Rfl:    rosuvastatin (CRESTOR) 5 MG tablet, Take 5 mg by mouth daily., Disp: , Rfl:   Radiology:   CT scanning of the chest 07/14/2022 without contrast: Heart/vessels: Normal heart size. No pericardial effusion. Ectasia of the proximal ascending aorta, measuring 4.4 cm .Ectasia of the descending aorta, immediately distal to the left subclavian origin, measuring approximately 4.3 x 4.4 cm in maximal diameter, perpendicular to longitudinal  the axis of flow.   Lungs/pleura: Scattered bilateral pulmonary nodules, marked on PACS on series 7, none larger than 5 mm. No focal lung consolidation. Bilateral lower lobe subsegmental atelectasis. No pleural effusion or pneumothorax.   CT Angiogram chest 07/27/2023: Compared to CT dated 07/14/2022:  1.  Similar aneurysm of the proximal descending thoracic aorta measuring up to 4.9 cm x4.4 cm.  2.  Similar mild aneurysm of ascending thoracic aorta measuring 4.2 cm.  3.  Similar aortic root aneurysm with the sinus of Valsalva measuring 4.3 cm.  4.  Unchanged scattered bilateral pulmonary micronodules. No new or enlarging nodules identified  No significant change from 01/19/2023 and 07/14/2022.   Cardiac Studies:   Coronary calcium score 04/14/2021: Left main: 0 LAD: 13.2 LCx: 0 RCA: 2.98. Total Agatston score 16.2, MeSA database percentile 17. Aortic root measurements 41 mm ascending aorta, descending aorta 36 mm. Elongated nodular density in the right middle lobe measures 3 mm.  ECHOCARDIOGRAM COMPLETE 01/19/2023 Normal LV size and systolic function  Normal RV size and sysolic function.  The aortic valve is trileaflet.  There is mild aortic regurgitation.  There is no aortic stenosis.  The aortic sinus is moderately dilated  4.6  Mildly dilated proximal ascending aorta  4.1cm  There is no pericardial effusion.     EKG:   EKG 08/05/2023: Normal sinus rhythm at rate of 52 bpm, left atrial enlargement, right bundle branch block.  Compared to 07/29/2022, no significant change.  Previous heart rate was 62 bpm.  Assessment     ICD-10-CM   1. Aneurysm of ascending aorta without rupture (HCC)  I71.21 EKG 12-Lead    2. Aneurysm of descending thoracic aorta without rupture (HCC)  I71.23     3. Primary hypertension  I10     4. Stage 3b chronic kidney disease (HCC)  N18.32      There are no discontinued medications.   No orders of the defined types were placed in this encounter.  Orders Placed This  Encounter  Procedures   EKG 12-Lead    Recommendations:   [name] is a 77 y.o. Caucasian male with thoracic aortic aneurysm including ascending and descending, hypertension, mild hypercholesterolemia, stage IIIa-B chronic kidney disease, history of remote IVC filter placement with a retrievable Celect IVC filter on 06/02/2011 during sepsis and DVT after surgery.  No evidence of abdominal attic aneurysm at that time.  He presents for an annual visit.  1. Aneurysm of ascending aorta without rupture (HCC) I reviewed the echocardiogram and also CT angiogram of the chest  that was performed at [facility], no significant change in the aneurysmal size although reported as 4.9 cm, there is the length of the aneurysm and aneurysmal dimension measures 4.4 centimeters which is unchanged.  Relatively stable thoracic aortic aneurysm since 2020 12-2021.  He is presently on an ARB, continue the same. - EKG 12-Lead  2. Aneurysm of descending thoracic aorta without rupture (HCC) As noted above.  He will need annual screening.  Do not think he needs 6 monthly screening as it has remained stable over the many years.  3. Primary hypertension Blood pressure under excellent control, continue ARB.  4. Stage 3b chronic kidney disease (HCC) Recent worsening renal function, he has been evaluated by nephrology.  Could consider addition of Jardiance for renal protection.  This may also help with weight loss.  I will forward this copy to Dr. Merri Brunette.  Will see him back on annual basis.  I could certainly follow-up on his thoracic aneurysm by CT scans done locally.   I reviewed his external labs, I also discussed with the patient and his wife regarding weight loss.  He does have a retrievable IVC filter and I had previously discussed regarding retrieval, patient has decided to let it be.   Yates Decamp, MD, [facility] 07/28/2023, 9:00 AM Office: [phone]

## 2023-08-10 DIAGNOSIS — H6123 Impacted cerumen, bilateral: Secondary | ICD-10-CM | POA: Diagnosis not present

## 2023-08-10 DIAGNOSIS — H938X3 Other specified disorders of ear, bilateral: Secondary | ICD-10-CM | POA: Diagnosis not present

## 2023-08-15 DIAGNOSIS — G4733 Obstructive sleep apnea (adult) (pediatric): Secondary | ICD-10-CM | POA: Diagnosis not present

## 2023-10-19 DIAGNOSIS — Z8546 Personal history of malignant neoplasm of prostate: Secondary | ICD-10-CM | POA: Diagnosis not present

## 2023-10-26 DIAGNOSIS — N393 Stress incontinence (female) (male): Secondary | ICD-10-CM | POA: Diagnosis not present

## 2023-10-26 DIAGNOSIS — Z8546 Personal history of malignant neoplasm of prostate: Secondary | ICD-10-CM | POA: Diagnosis not present

## 2023-10-26 DIAGNOSIS — N5201 Erectile dysfunction due to arterial insufficiency: Secondary | ICD-10-CM | POA: Diagnosis not present

## 2023-10-28 ENCOUNTER — Encounter: Payer: Self-pay | Admitting: Pharmacist

## 2023-10-31 DIAGNOSIS — K080 Exfoliation of teeth due to systemic causes: Secondary | ICD-10-CM | POA: Diagnosis not present

## 2023-11-22 DIAGNOSIS — G4733 Obstructive sleep apnea (adult) (pediatric): Secondary | ICD-10-CM | POA: Diagnosis not present

## 2023-12-02 DIAGNOSIS — N1832 Chronic kidney disease, stage 3b: Secondary | ICD-10-CM | POA: Diagnosis not present

## 2023-12-12 ENCOUNTER — Other Ambulatory Visit: Payer: Self-pay | Admitting: Cardiology

## 2023-12-12 DIAGNOSIS — N2581 Secondary hyperparathyroidism of renal origin: Secondary | ICD-10-CM | POA: Diagnosis not present

## 2023-12-12 DIAGNOSIS — N1832 Chronic kidney disease, stage 3b: Secondary | ICD-10-CM | POA: Diagnosis not present

## 2023-12-12 DIAGNOSIS — D631 Anemia in chronic kidney disease: Secondary | ICD-10-CM | POA: Diagnosis not present

## 2023-12-12 DIAGNOSIS — I10 Essential (primary) hypertension: Secondary | ICD-10-CM

## 2023-12-12 DIAGNOSIS — I129 Hypertensive chronic kidney disease with stage 1 through stage 4 chronic kidney disease, or unspecified chronic kidney disease: Secondary | ICD-10-CM | POA: Diagnosis not present

## 2023-12-12 DIAGNOSIS — I77810 Thoracic aortic ectasia: Secondary | ICD-10-CM

## 2023-12-12 DIAGNOSIS — N189 Chronic kidney disease, unspecified: Secondary | ICD-10-CM | POA: Diagnosis not present

## 2024-02-16 DIAGNOSIS — Z125 Encounter for screening for malignant neoplasm of prostate: Secondary | ICD-10-CM | POA: Diagnosis not present

## 2024-02-16 DIAGNOSIS — I10 Essential (primary) hypertension: Secondary | ICD-10-CM | POA: Diagnosis not present

## 2024-02-16 LAB — LAB REPORT - SCANNED
A1c: 5.9
EGFR: 46

## 2024-02-20 DIAGNOSIS — G4733 Obstructive sleep apnea (adult) (pediatric): Secondary | ICD-10-CM | POA: Diagnosis not present

## 2024-02-20 DIAGNOSIS — I10 Essential (primary) hypertension: Secondary | ICD-10-CM | POA: Diagnosis not present

## 2024-02-20 DIAGNOSIS — Z0000 Encounter for general adult medical examination without abnormal findings: Secondary | ICD-10-CM | POA: Diagnosis not present

## 2024-02-20 DIAGNOSIS — R32 Unspecified urinary incontinence: Secondary | ICD-10-CM | POA: Diagnosis not present

## 2024-02-20 DIAGNOSIS — K219 Gastro-esophageal reflux disease without esophagitis: Secondary | ICD-10-CM | POA: Diagnosis not present

## 2024-02-22 DIAGNOSIS — K080 Exfoliation of teeth due to systemic causes: Secondary | ICD-10-CM | POA: Diagnosis not present

## 2024-03-01 DIAGNOSIS — Z1212 Encounter for screening for malignant neoplasm of rectum: Secondary | ICD-10-CM | POA: Diagnosis not present

## 2024-03-01 DIAGNOSIS — Z1211 Encounter for screening for malignant neoplasm of colon: Secondary | ICD-10-CM | POA: Diagnosis not present

## 2024-03-06 DIAGNOSIS — E7800 Pure hypercholesterolemia, unspecified: Secondary | ICD-10-CM | POA: Diagnosis not present

## 2024-03-06 DIAGNOSIS — G4733 Obstructive sleep apnea (adult) (pediatric): Secondary | ICD-10-CM | POA: Diagnosis not present

## 2024-03-06 DIAGNOSIS — I10 Essential (primary) hypertension: Secondary | ICD-10-CM | POA: Diagnosis not present

## 2024-03-06 DIAGNOSIS — N1831 Chronic kidney disease, stage 3a: Secondary | ICD-10-CM | POA: Diagnosis not present

## 2024-03-07 LAB — COLOGUARD: COLOGUARD: NEGATIVE

## 2024-04-25 DIAGNOSIS — G4733 Obstructive sleep apnea (adult) (pediatric): Secondary | ICD-10-CM | POA: Diagnosis not present

## 2024-05-30 DIAGNOSIS — H938X3 Other specified disorders of ear, bilateral: Secondary | ICD-10-CM | POA: Diagnosis not present

## 2024-05-30 DIAGNOSIS — H9192 Unspecified hearing loss, left ear: Secondary | ICD-10-CM | POA: Diagnosis not present

## 2024-05-30 DIAGNOSIS — H6123 Impacted cerumen, bilateral: Secondary | ICD-10-CM | POA: Diagnosis not present

## 2024-06-11 DIAGNOSIS — I129 Hypertensive chronic kidney disease with stage 1 through stage 4 chronic kidney disease, or unspecified chronic kidney disease: Secondary | ICD-10-CM | POA: Diagnosis not present

## 2024-06-11 DIAGNOSIS — N2581 Secondary hyperparathyroidism of renal origin: Secondary | ICD-10-CM | POA: Diagnosis not present

## 2024-06-11 DIAGNOSIS — D631 Anemia in chronic kidney disease: Secondary | ICD-10-CM | POA: Diagnosis not present

## 2024-06-11 DIAGNOSIS — N1832 Chronic kidney disease, stage 3b: Secondary | ICD-10-CM | POA: Diagnosis not present

## 2024-06-26 DIAGNOSIS — K080 Exfoliation of teeth due to systemic causes: Secondary | ICD-10-CM | POA: Diagnosis not present

## 2024-07-04 DIAGNOSIS — G4733 Obstructive sleep apnea (adult) (pediatric): Secondary | ICD-10-CM | POA: Diagnosis not present

## 2024-07-04 DIAGNOSIS — L02811 Cutaneous abscess of head [any part, except face]: Secondary | ICD-10-CM | POA: Diagnosis not present

## 2024-07-12 DIAGNOSIS — K080 Exfoliation of teeth due to systemic causes: Secondary | ICD-10-CM | POA: Diagnosis not present

## 2024-07-17 DIAGNOSIS — I10 Essential (primary) hypertension: Secondary | ICD-10-CM | POA: Diagnosis not present

## 2024-07-17 DIAGNOSIS — N1831 Chronic kidney disease, stage 3a: Secondary | ICD-10-CM | POA: Diagnosis not present

## 2024-07-17 DIAGNOSIS — E7800 Pure hypercholesterolemia, unspecified: Secondary | ICD-10-CM | POA: Diagnosis not present

## 2024-07-17 DIAGNOSIS — G4733 Obstructive sleep apnea (adult) (pediatric): Secondary | ICD-10-CM | POA: Diagnosis not present

## 2024-07-25 DIAGNOSIS — J9811 Atelectasis: Secondary | ICD-10-CM | POA: Diagnosis not present

## 2024-07-25 DIAGNOSIS — I7121 Aneurysm of the ascending aorta, without rupture: Secondary | ICD-10-CM | POA: Diagnosis not present

## 2024-07-25 DIAGNOSIS — I7123 Aneurysm of the descending thoracic aorta, without rupture: Secondary | ICD-10-CM | POA: Diagnosis not present

## 2024-08-01 DIAGNOSIS — L02811 Cutaneous abscess of head [any part, except face]: Secondary | ICD-10-CM | POA: Diagnosis not present

## 2024-08-01 DIAGNOSIS — Z23 Encounter for immunization: Secondary | ICD-10-CM | POA: Diagnosis not present

## 2024-08-01 DIAGNOSIS — G4733 Obstructive sleep apnea (adult) (pediatric): Secondary | ICD-10-CM | POA: Diagnosis not present

## 2024-09-17 DIAGNOSIS — H40013 Open angle with borderline findings, low risk, bilateral: Secondary | ICD-10-CM | POA: Diagnosis not present

## 2024-09-17 DIAGNOSIS — H43811 Vitreous degeneration, right eye: Secondary | ICD-10-CM | POA: Diagnosis not present

## 2024-09-17 DIAGNOSIS — H4301 Vitreous prolapse, right eye: Secondary | ICD-10-CM | POA: Diagnosis not present

## 2024-09-17 DIAGNOSIS — Z961 Presence of intraocular lens: Secondary | ICD-10-CM | POA: Diagnosis not present

## 2024-09-21 ENCOUNTER — Ambulatory Visit (INDEPENDENT_AMBULATORY_CARE_PROVIDER_SITE_OTHER): Admitting: Nurse Practitioner

## 2024-09-21 ENCOUNTER — Encounter: Payer: Self-pay | Admitting: Nurse Practitioner

## 2024-09-21 VITALS — BP 122/70 | HR 66 | Ht 70.0 in | Wt 241.0 lb

## 2024-09-21 DIAGNOSIS — G4733 Obstructive sleep apnea (adult) (pediatric): Secondary | ICD-10-CM | POA: Diagnosis not present

## 2024-09-21 NOTE — Patient Instructions (Addendum)
 Continue to use CPAP every night, minimum of 4-6 hours a night.  Change equipment as directed. Wash your tubing with warm soap and water daily, hang to dry. Wash humidifier portion weekly. Use bottled, distilled water and change daily Be aware of reduced alertness and do not drive or operate heavy machinery if experiencing this or drowsiness.  Exercise encouraged, as tolerated. Healthy weight management discussed.  Avoid or decrease alcohol consumption and medications that make you more sleepy, if possible. Notify if persistent daytime sleepiness occurs even with consistent use of PAP therapy.  Change CPAP supplies... Every month Mask cushions and/or nasal pillows CPAP machine filters Every 3 months Mask frame (not including the headgear) CPAP tubing Every 6 months Mask headgear Chin strap (if applicable) Humidifier water tub  Look for a CPAP headgear cover on [employer] or online  Follow up in one year or sooner, if needed

## 2024-09-21 NOTE — Progress Notes (Signed)
 @Patient  ID: [name], male    DOB: Sep 11, 1946, 78 y.o.   MRN: [id number]  Chief Complaint  Patient presents with   Consult    Referred by Dr. Ryan Hives for eval of OSA. Currently on CPAP and doing well- Uses [facility] as DME.     Referring provider: Hives Ryan, MD  HPI: 78 year old male, former smoker referred for sleep consult. Past medical history significant for OSA on CPAP, HTN, HLD.   TEST/EVENTS:   09/21/2024:Today - sleep consult Discussed the use of AI scribe software for clinical note transcription with the patient, who gave verbal consent to proceed.  History of Present Illness [name] is a 78 year old male with sleep apnea who presents to establish care. He was referred by Dr. Hives for management of sleep apnea. His wife is with him today.  He has been using CPAP therapy for sleep apnea since 2005 and is currently on his third CPAP machine, which is approximately two to three months old. He reports only occasional snoring when he sleeps on his back. He generally feels rested and has not experienced significant daytime drowsiness, although he sometimes feels a little tired when watching TV. No issues with drowsy driving, sleep parasomnias.  He uses a full-face mask with his CPAP, which sometimes causes discomfort due to the strap digging into the back of his head. He has not tried a strap cover to alleviate this issue. He cleans his CPAP mask and tubing once a week. He uses decaf coffee and does not consume alcohol.  His social history includes being a retired personnel officer. Lives with his wife.  Goes to bed around 10-11 pm. Falls asleep in less than 5 minutes. Wakes 1-2 times a night. Gets up around 8am. Weight is stable over the last 2 years. Last sleep study was in 2018.  08/22/2024-09/20/2024: CPAP 5-15 cmH2O 30/30 days; 100% >4 hr; average use 8 hr 31 min Pressure 95th 14 Leaks 95th 6.7 AHI 1.6     No Known Allergies  Immunization History   Administered Date(s) Administered   Influenza,trivalent, recombinat, inj, PF 08/01/2024    Past Medical History:  Diagnosis Date   Diverticulitis of colon with perforation    sigmoid   DVT (deep venous thrombosis) (HCC)    Right leg   Hyperlipidemia    Hypertension    Leukocytosis    Sleep apnea    Ulcer    duodenal    Tobacco History: Social History   Tobacco Use  Smoking Status Former   Current packs/day: 0.00   Average packs/day: 1 pack/day for 20.0 years (20.0 ttl pk-yrs)   Types: Cigarettes   Start date: 72   Quit date: 69   Years since quitting: 51.8  Smokeless Tobacco Never   Counseling given: Not Answered   Outpatient Medications Prior to Visit  Medication Sig Dispense Refill   acetaminophen  (TYLENOL ) 325 MG tablet Take 325 mg by mouth every 4 (four) hours as needed.     FARXIGA 10 MG TABS tablet Take 10 mg by mouth daily.     losartan -hydrochlorothiazide (HYZAAR) 100-25 MG tablet TAKE 1 TABLET BY MOUTH EVERY DAY IN THE MORNING 90 tablet 2   Multiple Vitamins-Minerals (MULTIVITAMIN WITH MINERALS) tablet Take 1 tablet by mouth daily.       rosuvastatin (CRESTOR) 5 MG tablet Take 5 mg by mouth daily.     No facility-administered medications prior to visit.     Review of Systems: as  above    Physical Exam:  BP 122/70   Pulse 66   Ht 5' 10 (1.778 m) Comment: per pt  Wt 241 lb (109.3 kg)   SpO2 94% Comment: on RA  BMI 34.58 kg/m   GEN: Pleasant, interactive, well-appearing; obese; in no acute distress HEENT:  Normocephalic and atraumatic. PERRLA. Sclera white. Nasal turbinates pink, moist and patent bilaterally. No rhinorrhea present. Oropharynx pink and moist, without exudate or edema. No lesions, ulcerations, or postnasal drip. Mallampati III NECK:  Supple w/ fair ROM. No JVD present. Thyroid symmetrical with no goiter or nodules palpated. No lymphadenopathy.   CV: RRR, no m/r/g, no peripheral edema.  PULMONARY:  Unlabored, regular  breathing. Clear bilaterally A&P w/o wheezes/rales/rhonchi. No accessory muscle use.  GI: BS present and normoactive. Soft, non-tender to palpation. MSK: No erythema, warmth or tenderness.  Neuro: A/Ox3. No focal deficits noted.   Skin: Warm, no lesions or rashe Psych: Normal affect and behavior. Judgement and thought content appropriate.     Lab Results:  CBC    Component Value Date/Time   WBC 9.1 04/09/2012 0820   WBC 5.9 06/07/2011 0430   RBC 5.16 04/09/2012 0820   RBC 3.30 (L) 06/07/2011 0430   HGB 15.1 04/09/2012 0820   HGB 10.1 (L) 06/07/2011 0430   HCT 45.3 04/09/2012 0820   HCT 29.6 (L) 06/07/2011 0430   PLT 223 06/07/2011 0430   MCV 87.8 04/09/2012 0820   MCH 29.3 04/09/2012 0820   MCH 30.6 06/07/2011 0430   MCHC 33.3 04/09/2012 0820   MCHC 34.1 06/07/2011 0430   RDW 14.9 06/07/2011 0430   LYMPHSABS 1.4 05/31/2011 0329   MONOABS 0.8 05/31/2011 0329   EOSABS 0.4 05/31/2011 0329   BASOSABS 0.0 05/31/2011 0329    BMET    Component Value Date/Time   NA 138 06/03/2021 0750   K 4.1 06/03/2021 0750   CL 102 06/03/2021 0750   CO2 22 06/03/2021 0750   GLUCOSE 108 (H) 06/03/2021 0750   GLUCOSE 134 (H) 06/06/2011 0430   BUN 17 06/03/2021 0750   CREATININE 1.52 (H) 06/03/2021 0750   CALCIUM 9.6 06/03/2021 0750   GFRNONAA >60 06/06/2011 0430   GFRAA >60 06/06/2011 0430    BNP No results found for: BNP   Imaging:  No results found.  Administration History     None           No data to display          No results found for: NITRICOXIDE      Assessment & Plan:   OSA on CPAP OSA on CPAP. Excellent control and compliance. Machine is new. Will request sleep study records. He does have some issues with headgear fit and discomfort. Advised he try obtaining a headgear strap cover to protect his skin. He will notify if he continues to have issues. Receives benefit from use. Aware of risks of untreated OSA. Healthy weight loss encouraged given  correlation between OSA and obesity. Safe driving practices reviewed.   Patient Instructions  Continue to use CPAP every night, minimum of 4-6 hours a night.  Change equipment as directed. Wash your tubing with warm soap and water daily, hang to dry. Wash humidifier portion weekly. Use bottled, distilled water and change daily Be aware of reduced alertness and do not drive or operate heavy machinery if experiencing this or drowsiness.  Exercise encouraged, as tolerated. Healthy weight management discussed.  Avoid or decrease alcohol consumption and medications that make you more  sleepy, if possible. Notify if persistent daytime sleepiness occurs even with consistent use of PAP therapy.  Change CPAP supplies... Every month Mask cushions and/or nasal pillows CPAP machine filters Every 3 months Mask frame (not including the headgear) CPAP tubing Every 6 months Mask headgear Chin strap (if applicable) Humidifier water tub  Look for a CPAP headgear cover on Amazon or online  Follow up in one year or sooner, if needed    Advised if symptoms do not improve or worsen, to please contact office for sooner follow up or seek emergency care.   I spent 35 minutes of dedicated to the care of this patient on the date of this encounter to include pre-visit review of records, face-to-face time with the patient discussing conditions above, post visit ordering of testing, clinical documentation with the electronic health record, making appropriate referrals as documented, and communicating necessary findings to members of the patients care team.  Comer LULLA Rouleau, NP 09/21/2024  Pt aware and understands NP's role.

## 2024-09-21 NOTE — Assessment & Plan Note (Signed)
 OSA on CPAP. Excellent control and compliance. Machine is new. Will request sleep study records. He does have some issues with headgear fit and discomfort. Advised he try obtaining a headgear strap cover to protect his skin. He will notify if he continues to have issues. Receives benefit from use. Aware of risks of untreated OSA. Healthy weight loss encouraged given correlation between OSA and obesity. Safe driving practices reviewed.   Patient Instructions  Continue to use CPAP every night, minimum of 4-6 hours a night.  Change equipment as directed. Wash your tubing with warm soap and water daily, hang to dry. Wash humidifier portion weekly. Use bottled, distilled water and change daily Be aware of reduced alertness and do not drive or operate heavy machinery if experiencing this or drowsiness.  Exercise encouraged, as tolerated. Healthy weight management discussed.  Avoid or decrease alcohol consumption and medications that make you more sleepy, if possible. Notify if persistent daytime sleepiness occurs even with consistent use of PAP therapy.  Change CPAP supplies... Every month Mask cushions and/or nasal pillows CPAP machine filters Every 3 months Mask frame (not including the headgear) CPAP tubing Every 6 months Mask headgear Chin strap (if applicable) Humidifier water tub  Look for a CPAP headgear cover on Amazon or online  Follow up in one year or sooner, if needed

## 2024-10-17 DIAGNOSIS — Z8546 Personal history of malignant neoplasm of prostate: Secondary | ICD-10-CM | POA: Diagnosis not present

## 2024-10-24 DIAGNOSIS — C61 Malignant neoplasm of prostate: Secondary | ICD-10-CM | POA: Diagnosis not present

## 2024-11-01 DIAGNOSIS — K080 Exfoliation of teeth due to systemic causes: Secondary | ICD-10-CM | POA: Diagnosis not present
